# Patient Record
Sex: Female | Born: 1935 | Race: White | Hispanic: No | Smoking: Never smoker
Health system: Southern US, Community
[De-identification: ages and names within clinical notes are randomized; demographics above are authoritative.]

## PROBLEM LIST (undated history)

## (undated) DIAGNOSIS — M542 Cervicalgia: Secondary | ICD-10-CM

## (undated) DIAGNOSIS — E785 Hyperlipidemia, unspecified: Secondary | ICD-10-CM

## (undated) DIAGNOSIS — C801 Malignant (primary) neoplasm, unspecified: Secondary | ICD-10-CM

## (undated) DIAGNOSIS — I10 Essential (primary) hypertension: Secondary | ICD-10-CM

## (undated) DIAGNOSIS — R9430 Abnormal result of cardiovascular function study, unspecified: Secondary | ICD-10-CM

## (undated) DIAGNOSIS — G8929 Other chronic pain: Secondary | ICD-10-CM

## (undated) DIAGNOSIS — N950 Postmenopausal bleeding: Secondary | ICD-10-CM

## (undated) DIAGNOSIS — K219 Gastro-esophageal reflux disease without esophagitis: Secondary | ICD-10-CM

## (undated) DIAGNOSIS — I2510 Atherosclerotic heart disease of native coronary artery without angina pectoris: Secondary | ICD-10-CM

## (undated) DIAGNOSIS — K922 Gastrointestinal hemorrhage, unspecified: Secondary | ICD-10-CM

## (undated) HISTORY — DX: Postmenopausal bleeding: N95.0

## (undated) HISTORY — PX: DILATION AND CURETTAGE OF UTERUS: SHX78

## (undated) HISTORY — PX: CARDIAC CATHETERIZATION: SHX172

## (undated) HISTORY — DX: Other chronic pain: G89.29

## (undated) HISTORY — DX: Gastrointestinal hemorrhage, unspecified: K92.2

## (undated) HISTORY — DX: Essential (primary) hypertension: I10

## (undated) HISTORY — DX: Gastro-esophageal reflux disease without esophagitis: K21.9

## (undated) HISTORY — DX: Abnormal result of cardiovascular function study, unspecified: R94.30

## (undated) HISTORY — DX: Cervicalgia: M54.2

## (undated) HISTORY — DX: Atherosclerotic heart disease of native coronary artery without angina pectoris: I25.10

## (undated) HISTORY — DX: Hyperlipidemia, unspecified: E78.5

---

## 1968-04-12 HISTORY — PX: CHOLECYSTECTOMY: SHX55

## 2003-05-28 ENCOUNTER — Other Ambulatory Visit: Admission: RE | Admit: 2003-05-28 | Discharge: 2003-05-28 | Payer: Self-pay | Admitting: Obstetrics and Gynecology

## 2003-06-13 ENCOUNTER — Ambulatory Visit (HOSPITAL_COMMUNITY): Admission: RE | Admit: 2003-06-13 | Discharge: 2003-06-13 | Payer: Self-pay | Admitting: Obstetrics and Gynecology

## 2003-06-13 HISTORY — PX: CERVICAL POLYPECTOMY: SHX88

## 2005-01-20 ENCOUNTER — Inpatient Hospital Stay (HOSPITAL_COMMUNITY): Admission: EM | Admit: 2005-01-20 | Discharge: 2005-01-22 | Payer: Self-pay | Admitting: Emergency Medicine

## 2005-01-20 ENCOUNTER — Ambulatory Visit: Payer: Self-pay | Admitting: Cardiology

## 2005-01-22 ENCOUNTER — Ambulatory Visit: Payer: Self-pay | Admitting: Cardiology

## 2005-02-03 ENCOUNTER — Ambulatory Visit: Payer: Self-pay | Admitting: Cardiology

## 2005-03-03 ENCOUNTER — Ambulatory Visit: Payer: Self-pay | Admitting: Cardiology

## 2005-04-14 ENCOUNTER — Ambulatory Visit: Payer: Self-pay | Admitting: Cardiology

## 2005-06-10 ENCOUNTER — Inpatient Hospital Stay (HOSPITAL_COMMUNITY): Admission: AD | Admit: 2005-06-10 | Discharge: 2005-06-13 | Payer: Self-pay | Admitting: Cardiovascular Disease

## 2005-06-10 ENCOUNTER — Ambulatory Visit: Payer: Self-pay | Admitting: Cardiovascular Disease

## 2005-06-30 ENCOUNTER — Ambulatory Visit: Payer: Self-pay | Admitting: Cardiology

## 2005-10-07 ENCOUNTER — Ambulatory Visit: Payer: Self-pay | Admitting: Cardiology

## 2005-11-25 ENCOUNTER — Ambulatory Visit: Payer: Self-pay | Admitting: Cardiology

## 2005-12-10 ENCOUNTER — Ambulatory Visit: Payer: Self-pay | Admitting: Cardiology

## 2006-05-24 ENCOUNTER — Ambulatory Visit: Payer: Self-pay | Admitting: Cardiology

## 2006-08-24 ENCOUNTER — Ambulatory Visit: Payer: Self-pay | Admitting: Cardiology

## 2006-08-25 ENCOUNTER — Ambulatory Visit: Payer: Self-pay | Admitting: Cardiology

## 2006-09-09 ENCOUNTER — Encounter: Payer: Self-pay | Admitting: Cardiology

## 2006-12-27 ENCOUNTER — Ambulatory Visit: Payer: Self-pay | Admitting: Cardiology

## 2007-08-08 ENCOUNTER — Encounter: Payer: Self-pay | Admitting: Cardiology

## 2008-01-26 ENCOUNTER — Ambulatory Visit: Payer: Self-pay | Admitting: Cardiology

## 2008-01-26 ENCOUNTER — Encounter: Payer: Self-pay | Admitting: Physician Assistant

## 2008-02-01 ENCOUNTER — Ambulatory Visit: Payer: Self-pay | Admitting: Cardiology

## 2008-05-09 ENCOUNTER — Encounter: Payer: Self-pay | Admitting: Cardiology

## 2008-07-08 ENCOUNTER — Ambulatory Visit: Payer: Self-pay | Admitting: Cardiology

## 2008-08-16 ENCOUNTER — Encounter: Payer: Self-pay | Admitting: Cardiology

## 2008-08-30 ENCOUNTER — Encounter: Payer: Self-pay | Admitting: Cardiology

## 2008-10-21 ENCOUNTER — Encounter: Payer: Self-pay | Admitting: Cardiology

## 2008-12-11 ENCOUNTER — Encounter: Payer: Self-pay | Admitting: Cardiology

## 2009-02-04 ENCOUNTER — Ambulatory Visit: Payer: Self-pay | Admitting: Cardiology

## 2009-02-04 DIAGNOSIS — I259 Chronic ischemic heart disease, unspecified: Secondary | ICD-10-CM | POA: Insufficient documentation

## 2009-02-04 DIAGNOSIS — E669 Obesity, unspecified: Secondary | ICD-10-CM | POA: Insufficient documentation

## 2009-02-04 DIAGNOSIS — E785 Hyperlipidemia, unspecified: Secondary | ICD-10-CM | POA: Insufficient documentation

## 2009-02-04 DIAGNOSIS — M542 Cervicalgia: Secondary | ICD-10-CM | POA: Insufficient documentation

## 2009-02-04 DIAGNOSIS — I10 Essential (primary) hypertension: Secondary | ICD-10-CM | POA: Insufficient documentation

## 2009-03-14 ENCOUNTER — Ambulatory Visit: Payer: Self-pay | Admitting: Cardiology

## 2009-03-14 ENCOUNTER — Telehealth (INDEPENDENT_AMBULATORY_CARE_PROVIDER_SITE_OTHER): Payer: Self-pay | Admitting: *Deleted

## 2009-03-17 ENCOUNTER — Encounter: Payer: Self-pay | Admitting: Cardiology

## 2009-03-17 ENCOUNTER — Encounter (INDEPENDENT_AMBULATORY_CARE_PROVIDER_SITE_OTHER): Payer: Self-pay | Admitting: *Deleted

## 2009-03-17 ENCOUNTER — Ambulatory Visit: Payer: Self-pay | Admitting: Cardiology

## 2009-03-17 DIAGNOSIS — R079 Chest pain, unspecified: Secondary | ICD-10-CM | POA: Insufficient documentation

## 2009-03-17 DIAGNOSIS — R072 Precordial pain: Secondary | ICD-10-CM | POA: Insufficient documentation

## 2009-03-24 ENCOUNTER — Ambulatory Visit: Payer: Self-pay | Admitting: Cardiology

## 2009-03-24 ENCOUNTER — Encounter: Payer: Self-pay | Admitting: Cardiology

## 2009-03-26 ENCOUNTER — Encounter (INDEPENDENT_AMBULATORY_CARE_PROVIDER_SITE_OTHER): Payer: Self-pay | Admitting: *Deleted

## 2009-03-26 ENCOUNTER — Encounter: Payer: Self-pay | Admitting: Cardiology

## 2009-04-29 ENCOUNTER — Ambulatory Visit: Payer: Self-pay | Admitting: Cardiology

## 2009-12-23 ENCOUNTER — Encounter: Payer: Self-pay | Admitting: Cardiology

## 2010-02-02 ENCOUNTER — Telehealth (INDEPENDENT_AMBULATORY_CARE_PROVIDER_SITE_OTHER): Payer: Self-pay | Admitting: *Deleted

## 2010-02-16 ENCOUNTER — Telehealth (INDEPENDENT_AMBULATORY_CARE_PROVIDER_SITE_OTHER): Payer: Self-pay | Admitting: *Deleted

## 2010-02-17 ENCOUNTER — Encounter: Payer: Self-pay | Admitting: Cardiology

## 2010-05-12 NOTE — Progress Notes (Signed)
Summary: Pending Echo  ---- Converted from flag ---- ---- 02/02/2010 9:29 AM, Zachary George wrote: fell over the weekend and she has a thoracic vertebral fracture so we are cancelling the echo for now. ------------------------------

## 2010-05-12 NOTE — Letter (Signed)
Summary: Letter/ FAXED [facility]  Letter/ FAXED [facility]   Imported By: Dorise Hiss 02/24/2010 12:35:14  _____________________________________________________________________  External Attachment:    Type:   Image     Comment:   External Document

## 2010-05-12 NOTE — Miscellaneous (Signed)
  Clinical Lists Changes  Observations: Added new observation of NUCLEAR NOS: Comments :             1. This study was performed using the Standard Bruce exercise protocol. The patient exercised into stage 2 reaching 133 bpm, 89% MPHR. Maximum METs of 7 were achieved. The patient experienced no chest pain. No diagnostic ST changes. There was a hypertensive response to stress.  2. Probably normal LV perfusion. Limitations and artifact were due to breast tissue. There was a small, fixed defect in the mid anteroseptal segment(s). The degree of photon reduction was mild. There was a small, reversible defect in the mid anterolateral segment(s). The degree of photon reduction was mild. Suspect variable soft tissue attenuation, however cannot completely exclude a small degree of lateral ischemia. The patient's calculated post stress LVEF was 61%. TID Ratio 1.19.       (03/24/2009 15:19)      Nuclear Study  Procedure date:  03/24/2009  Findings:      Comments :             1. This study was performed using the Standard Bruce exercise protocol. The patient exercised into stage 2 reaching 133 bpm, 89% MPHR. Maximum METs of 7 were achieved. The patient experienced no chest pain. No diagnostic ST changes. There was a hypertensive response to stress.  2. Probably normal LV perfusion. Limitations and artifact were due to breast tissue. There was a small, fixed defect in the mid anteroseptal segment(s). The degree of photon reduction was mild. There was a small, reversible defect in the mid anterolateral segment(s). The degree of photon reduction was mild. Suspect variable soft tissue attenuation, however cannot completely exclude a small degree of lateral ischemia. The patient's calculated post stress LVEF was 61%. TID Ratio 1.19.        Appended Document:  Dr Degent's patient

## 2010-05-12 NOTE — Assessment & Plan Note (Signed)
Summary: ROV-PER KATZ ADD   Visit Type:  Follow-up Primary Provider:  Wyvonnia Lora  CC:  follow-up visit.  History of Present Illness: Pleasant female with past medical history of coronary artery disease status post PCI who returns following an evaluation for chest pain on December 3 by Dr. Myrtis Ser. The patient apparently had a stressful day on Thursday of last week. She was having intermittent chest pain. The pain was in the left chest area without radiation or associated symptoms. It is not pleuritic or positional nor is it related to food. It was nonexertional. It was unlike her previous pain prior to her intervention. It lasts several seconds and resolve spontaneously. Electrocardiogram on the third was normal. Dr. Myrtis Ser also drew blood work which showed normal enzymes times one. He asked that she return today. She had no further chest pain over the weekend. She states she had one second of chest pain earlier this morning. She does not have exertional chest pain, dyspnea on exertion, orthopnea, PND, pedal edema or syncope.  Preventive Screening-Counseling & Management  Alcohol-Tobacco     Smoking Status: never  Current Medications (verified): 1)  Plavix 75 Mg Tabs (Clopidogrel Bisulfate) .... Take 1 Tablet By Mouth Once A Day 2)  Pravachol 80 Mg Tabs (Pravastatin Sodium) .... Take 1 Tablet By Mouth Once A Day 3)  Aspirin 81 Mg Tbec (Aspirin) .... Take 1 Tablet By Mouth Once A Day 4)  Toprol Xl 50 Mg Xr24h-Tab (Metoprolol Succinate) .... Take 1 Tablet By Mouth Once A Day 5)  Lisinopril-Hydrochlorothiazide 20-25 Mg Tabs (Lisinopril-Hydrochlorothiazide) .... Take 1 Tablet By Mouth Once A Day 6)  Fish Oil 1000 Mg Caps (Omega-3 Fatty Acids) .... Take 2 Tablet By Mouth Two Times A Day 7)  Calcium 500 Mg Tabs (Calcium Carbonate) .... Take 1 Tablet By Mouth Two Times A Day 8)  Vitamin C 500 Mg Tabs (Ascorbic Acid) .... Take 1 Tablet By Mouth Once A Day 9)  Nitrostat 0.4 Mg Subl (Nitroglycerin) ....  Dissolve One Tablet Under Tongue For Severe Chest Pain As Needed Every 5 Minutes, Not To Exceed 3 in 15 Min Time Frame  Allergies (verified): 1)  Penicillin V Potassium (Penicillin V Potassium)  Past History:  Past Medical History: Reviewed history from 02/04/2009 and no changes required.  1. Severe single-vessel coronary artery disease.       a.     Non-ST elevation myocardial infarction/drug-eluting stenting        of circumflex, secondary to subtotal in-stent restenosis, March        2007.       b.     Non-ST elevation myocardial infarction/bare-metal stenting        of subtotal circumflex, October 2006.       c.     Ejection fraction 55%.       d.     Negative exercise Cardiolite; ejection fraction 65%, May        2008.   2. Hypertension.   3. Dyslipidemia.       a.     Zocor intolerant.   4. Obesity.   5. Chronic neck pain. Acute GI bleed  Social History: Reviewed history from 07/18/2008 and no changes required. Tobacco Use - No.  Alcohol Use - no  Review of Systems       no fevers or chills, productive cough, hemoptysis, dysphasia, odynophagia, melena, hematochezia, dysuria, hematuria, rash, seizure activity, orthopnea, PND, pedal edema, claudication. Remaining systems are negative.   Vital Signs:  Patient profile:   75 year old female Height:      62.5 inches Weight:      209 pounds Pulse rate:   67 / minute BP sitting:   137 / 82  (left arm) Cuff size:   large  Vitals Entered By: Carlye Grippe (March 17, 2009 9:42 AM) CC: follow-up visit   Physical Exam  General:  well-developed well-nourished in no acute distress. Skin is warm and dry. Head:  HEENT is normal. Neck:  supple with no bruits. No thyromegaly. Lungs:  clear to auscultation Heart:  regular rate and rhythm Abdomen:  soft and nontender. No masses palpated. Extremities:  no edema. Neurologic:  grossly intact.   Impression & Recommendations:  Problem # 1:  CHEST PAIN  (ICD-786.50) Patient's symptoms are atypical and lasts only seconds at a time. Electrocardiogram and enzymes were negative. I will arrange an outpatient Myoview for risk stratification. Her updated medication list for this problem includes:    Plavix 75 Mg Tabs (Clopidogrel bisulfate) .Marland Kitchen... Take 1 tablet by mouth once a day    Aspirin 81 Mg Tbec (Aspirin) .Marland Kitchen... Take 1 tablet by mouth once a day    Toprol Xl 50 Mg Xr24h-tab (Metoprolol succinate) .Marland Kitchen... Take 1 tablet by mouth once a day    Lisinopril-hydrochlorothiazide 20-25 Mg Tabs (Lisinopril-hydrochlorothiazide) .Marland Kitchen... Take 1 tablet by mouth once a day    Nitrostat 0.4 Mg Subl (Nitroglycerin) .Marland Kitchen... Dissolve one tablet under tongue for severe chest pain as needed every 5 minutes, not to exceed 3 in 15 min time frame  Problem # 2:  DYSLIPIDEMIA (ICD-272.4) Continue statin. Her updated medication list for this problem includes:    Pravachol 80 Mg Tabs (Pravastatin sodium) .Marland Kitchen... Take 1 tablet by mouth once a day  Problem # 3:  ESSENTIAL HYPERTENSION, BENIGN (ICD-401.1) Blood pressure controlled on present medications. Will continue. Her updated medication list for this problem includes:    Aspirin 81 Mg Tbec (Aspirin) .Marland Kitchen... Take 1 tablet by mouth once a day    Toprol Xl 50 Mg Xr24h-tab (Metoprolol succinate) .Marland Kitchen... Take 1 tablet by mouth once a day    Lisinopril-hydrochlorothiazide 20-25 Mg Tabs (Lisinopril-hydrochlorothiazide) .Marland Kitchen... Take 1 tablet by mouth once a day  Problem # 4:  CORONARY ARTERY DISEASE, S/P PTCA (ICD-414.9) Continue aspirin, Plavix, beta blocker, ACE inhibitor and statin. Her updated medication list for this problem includes:    Plavix 75 Mg Tabs (Clopidogrel bisulfate) .Marland Kitchen... Take 1 tablet by mouth once a day    Aspirin 81 Mg Tbec (Aspirin) .Marland Kitchen... Take 1 tablet by mouth once a day    Toprol Xl 50 Mg Xr24h-tab (Metoprolol succinate) .Marland Kitchen... Take 1 tablet by mouth once a day    Lisinopril-hydrochlorothiazide 20-25 Mg Tabs  (Lisinopril-hydrochlorothiazide) .Marland Kitchen... Take 1 tablet by mouth once a day    Nitrostat 0.4 Mg Subl (Nitroglycerin) .Marland Kitchen... Dissolve one tablet under tongue for severe chest pain as needed every 5 minutes, not to exceed 3 in 15 min time frame  Orders: Nuclear Med (Nuc Med)  Patient Instructions: 1)  Stress Myoview 2)  Follow up in 4-6 weeks.

## 2010-05-12 NOTE — Assessment & Plan Note (Signed)
Summary: ADD ON PER DR. TAPPER/Ellyssa Zagal, CHEST PAIN-JM   Visit Type:  Follow-up Primary Provider:  Wyvonnia Lora  CC:  CAD.  History of Present Illness: The patient is seen today for evaluation of chest pain.  She followed carefully by Dr.De Natasha Bence.  She has known disease and had an intervention in 2006.  She then had in-stent renarrowing and had another intervention in 2007.  She's done well since then.  Historically when she had her significant lesions, she had a full tight sensation in her chest and it feeling needing to belch.  Yesterday the patient was under severe stress.  She then began to feel some discomfort localized to her left upper chest.  It does not sound to me like her classic angina.  She did not have any belching.  This discomfort waxed and waned during the day.  She then took 3 full dose aspirins and improved.  She did not try a nitroglycerin.  This morning she called Dr.Tapper's office and communication came to [place] and we arranged to see her as an add-on.  She is stable now.  She does not have any chest pain.  She became teary-eyed when beginning to tell me about the amount of stress she had yesterday.  We also discussed the fact he has never tried nitroglycerin.  She was told by a friend that if she did try it she might have a severe headache.  We discussed this fully.  Preventive Screening-Counseling & Management  Alcohol-Tobacco     Smoking Status: never  Current Medications (verified): 1)  Plavix 75 Mg Tabs (Clopidogrel Bisulfate) .... Take 1 Tablet By Mouth Once A Day 2)  Pravachol 80 Mg Tabs (Pravastatin Sodium) .... Take 1 Tablet By Mouth Once A Day 3)  Aspirin 81 Mg Tbec (Aspirin) .... Take 1 Tablet By Mouth Once A Day 4)  Toprol Xl 50 Mg Xr24h-Tab (Metoprolol Succinate) .... Take 1 Tablet By Mouth Once A Day 5)  Lisinopril-Hydrochlorothiazide 20-25 Mg Tabs (Lisinopril-Hydrochlorothiazide) .... Take 1 Tablet By Mouth Once A Day 6)  Fish Oil 1000 Mg Caps (Omega-3 Fatty  Acids) .... Take 2 Tablet By Mouth Two Times A Day 7)  Calcium 500 Mg Tabs (Calcium Carbonate) .... Take 1 Tablet By Mouth Two Times A Day 8)  Vitamin C 500 Mg Tabs (Ascorbic Acid) .... Take 1 Tablet By Mouth Once A Day 9)  Nitrostat 0.4 Mg Subl (Nitroglycerin) .... Dissolve One Tablet Under Tongue For Severe Chest Pain As Needed Every 5 Minutes, Not To Exceed 3 in 15 Min Time Frame  Allergies: 1)  Penicillin V Potassium (Penicillin V Potassium)  Past History:  Past Medical History: Last updated: 02/04/2009  1. Severe single-vessel coronary artery disease.       a.     Non-ST elevation myocardial infarction/drug-eluting stenting        of circumflex, secondary to subtotal in-stent restenosis, March        2007.       b.     Non-ST elevation myocardial infarction/bare-metal stenting        of subtotal circumflex, October 2006.       c.     Ejection fraction 55%.       d.     Negative exercise Cardiolite; ejection fraction 65%, May        2008.   2. Hypertension.   3. Dyslipidemia.       a.     Zocor intolerant.   4. Obesity.  5. Chronic neck pain. Acute GI bleed  Review of Systems       Patient denies fever, chills, headache, sweats, rash, change in vision, change in hearing, shortness of breath, cough, nausea vomiting, urinary symptoms, musculoskeletal problems.  All other systems are reviewed and are negative.  Vital Signs:  Patient profile:   75 year old female Height:      62.5 inches Weight:      207.50 pounds BMI:     37.48 BP sitting:   140 / 85  (left arm) Cuff size:   large  Vitals Entered By: Cyril Loosen, RN, BSN (March 14, 2009 11:10 AM)  Nutrition Counseling: Patient's BMI is greater than 25 and therefore counseled on weight management options. CC: CAD Comments Pt states chest tightness yesterday. She states she did not think to take NTG but did take ASA 325mg  x3 last evening with relief. She thought this was the dosage EMS usually gives. Notified pt  they usually give 81mg  x 3 or 4 not 325mg .    Physical Exam  General:  patient is overweight but stable in general. Head:  head is normocephalic. Eyes:  no xanthelasma. Neck:  no jugular venous tension. Chest Wall:  there is no chest wall tenderness today. Lungs:  lungs are clear.  Respiratory effort is nonlabored. Heart:  cardiac exam revealed S1-S2.  There no clicks or significant murmurs. Abdomen:  abdomen is soft. Msk:  no musculoskeletal deformities. Extremities:  no peripheral edema. Skin:  no skin rashes. Psych:  patient is oriented to person time and place.  Affect is normal.   Impression & Recommendations:  Problem # 1:  DYSLIPIDEMIA (ICD-272.4)  Her updated medication list for this problem includes:    Pravachol 80 Mg Tabs (Pravastatin sodium) .Marland Kitchen... Take 1 tablet by mouth once a day Patient is on appropriate medications for lipids.  Problem # 2:  ESSENTIAL HYPERTENSION, BENIGN (ICD-401.1)  Her updated medication list for this problem includes:    Aspirin 81 Mg Tbec (Aspirin) .Marland Kitchen... Take 1 tablet by mouth once a day    Toprol Xl 50 Mg Xr24h-tab (Metoprolol succinate) .Marland Kitchen... Take 1 tablet by mouth once a day    Lisinopril-hydrochlorothiazide 20-25 Mg Tabs (Lisinopril-hydrochlorothiazide) .Marland Kitchen... Take 1 tablet by mouth once a day Blood pressure is reasonably controlled today.  No change in her medicines.  Problem # 3:  CORONARY ARTERY DISEASE, S/P PTCA (ICD-414.9)  Her updated medication list for this problem includes:    Plavix 75 Mg Tabs (Clopidogrel bisulfate) .Marland Kitchen... Take 1 tablet by mouth once a day    Aspirin 81 Mg Tbec (Aspirin) .Marland Kitchen... Take 1 tablet by mouth once a day    Toprol Xl 50 Mg Xr24h-tab (Metoprolol succinate) .Marland Kitchen... Take 1 tablet by mouth once a day    Lisinopril-hydrochlorothiazide 20-25 Mg Tabs (Lisinopril-hydrochlorothiazide) .Marland Kitchen... Take 1 tablet by mouth once a day    Nitrostat 0.4 Mg Subl (Nitroglycerin) .Marland Kitchen... Dissolve one tablet under tongue for severe  chest pain as needed every 5 minutes, not to exceed 3 in 15 min time frame The patient has known coronary artery disease.  As outlined in the history of present illness she had symptoms yesterday.  I am not convinced that this is her true angina..  She is under a great deal of stress.  EKG was done and reviewed by me.  There is no significant abnormality.  We will check CPK and troponin today to be sure that there was no significant change.  However  I'm not convinced that this was her angina and she will be allowed to go home.  We will give her a new prescription of nitroglycerin and have her try some if she has recurrent symptoms.  I will arrange for early followup with Dr.De Natasha Bence to be sure that she remains stable.  If there are significant recurring symptoms repeat catheterization will be needed.  Problem # 4:  * STRESS I do not have the specifics of why the patient was stressed yesterday.  I believe this played a role in her feeling poorly.  Hopefully she will be able to resolve these issues.  Other Orders: EKG w/ Interpretation (93000) T-Troponin I ([id number]) T-CK MB Fract ([id number]) T-CK Total ([id number])  Patient Instructions: 1)  Labs:  CPK, Troponin level - results by end of day 2)  Script for as needed nitroglycerin. 3)  Follow up on Monday, March 17, 2009 at 9:45 Prescriptions: NITROSTAT 0.4 MG SUBL (NITROGLYCERIN) dissolve one tablet under tongue for severe chest pain as needed every 5 minutes, not to exceed 3 in 15 min time frame  #25 x 3   Entered by:   Hoover Brunette, LPN   Authorized by:   Talitha Givens, MD, [facility]   Signed by:   Hoover Brunette, LPN on 96/29/5284   Method used:   Electronically to        [facility]* (retail)       [address]       [place], [place]  [address]       Ph: [phone]       Fax: [phone]   RxID:   [id number]

## 2010-05-12 NOTE — Assessment & Plan Note (Signed)
Summary: Nuclear    Nuclear ETT  Procedure date:  03/24/2009  Findings:      standard Bruce exercise protocol.  Patient exercised into stage II reaching hundred 33 beats/min 89% of maximum predicted heart rate.  Maximum 7 METs were achieved.  No chest pain reported no ST segment changes hypertensive blood pressure response.  Normal LV perfusion with a small fixed defect in the mid anteroseptal segments.  Suspects variable soft tissue attenuation however cannot completely exclude a small degree of lateral ischemia.  Ejection fraction 61%.  Of

## 2010-05-12 NOTE — Letter (Signed)
Summary: Pharmacist, community at [facility]. [address]   [place], [place] [address]   Phone: [phone]  Fax: [phone]      [facility] Cardi[facility]  Cardiolite Stress Test     [facility]  Your doctor has ordered a Cardiolite Stress Test to help determine the condition of your heart during stress. If you take blood pressure medicine, ask your doctor if you should take it the day of your test. You should not have anything to eat or drink at least 4 hours before your test is scheduled, and no caffeine (coffee, tea, decaf. or chocolate) for 24 hours before your test.   You will need to register at the Outpatient/Main Entrance at the hospital 30 minutes before your appointment time. It is a good idea to bring a copy of your order with you. They will direct you to the Diagnostic Imaging (Radiology) Department.  You will be asked to undress from the waist up and be given a hospital gown to wear, so dress comfortably from the waist down, for example:    Sweat pants, shorts or skirt   Rubber-soled lace up shoes (i.e. tennis shoes)  Plan on about three hours from registration to release from the hospital.    Date of Test:              Time of Test

## 2010-05-12 NOTE — Letter (Signed)
Summary: Engineer, materials at [facility]  [address]   [place], [place] [address]   Phone: [phone]  Fax: [phone]        March 26, 2009 MRN: [id number]     [facility] [address] [place], [place]  [address]    Dear Ms. Botero,  Your test ordered by Selena Batten has been reviewed by your physician (or physician assistant) and was found to be normal or stable. Your physician (or physician assistant) felt no changes were needed at this time.  ____ Echocardiogram  __X__ Cardiac Stress Test  ____ Lab Work  ____ Peripheral vascular study of arms, legs or neck  ____ CT scan or X-ray  ____ Lung or Breathing test  ____ Other:   Thank you.   Cyril Loosen, RN, BSN    Duane Boston, M.D., F.A.C.C. Thressa Sheller, M.D., F.A.C.C. Oneal Grout, M.D., F.A.C.C. Cheree Ditto, M.D., F.A.C.C. Daiva Nakayama, M.D., F.A.C.C. Kenney Houseman, M.D., F.A.C.C. Jeanne Ivan, PA-C

## 2010-05-12 NOTE — Assessment & Plan Note (Signed)
Summary: 6 wk fu -srs   Visit Type:  Follow-up Primary Provider:  Wyvonnia Lora  CC:  follow-up visit.  History of Present Illness: the patient is a 75 year old female with history of coronary artery disease status post prior stent placement. She was evaluated a few months ago with atypical chest pain. Cardiolite study was low risk, albeit with a questionable small lateral defect. The patient however reports no recurrent symptoms. She reports no chest pain shortness of breath orthopnea or PND she has no palpitations or syncope. She remains physically active. Her only complaint of symptoms related to benign positional vertigo with dizziness  Clinical Review Panels:  Nuclear Stress Testing Nuclear Stress Test Findings standard Bruce exercise protocol.  Patient exercised into stage II reaching hundred 33 beats/min 89% of maximum predicted heart rate.  Maximum 7 METs were achieved.  No chest pain reported no ST segment changes hypertensive blood pressure response.  Normal LV perfusion with a small fixed defect in the mid anteroseptal segments.  Suspects variable soft tissue attenuation however cannot completely exclude a small degree of lateral ischemia.  Ejection fraction 61%.  Of (03/24/2009)    Preventive Screening-Counseling & Management  Alcohol-Tobacco     Smoking Status: never  Current Medications (verified): 1)  Plavix 75 Mg Tabs (Clopidogrel Bisulfate) .... Take 1 Tablet By Mouth Once A Day 2)  Pravachol 80 Mg Tabs (Pravastatin Sodium) .... Take 1 Tablet By Mouth Once A Day 3)  Aspirin 81 Mg Tbec (Aspirin) .... Take 1 Tablet By Mouth Once A Day 4)  Toprol Xl 50 Mg Xr24h-Tab (Metoprolol Succinate) .... Take 1 Tablet By Mouth Once A Day 5)  Lisinopril-Hydrochlorothiazide 20-25 Mg Tabs (Lisinopril-Hydrochlorothiazide) .... Take 1 Tablet By Mouth Once A Day 6)  Fish Oil 1000 Mg Caps (Omega-3 Fatty Acids) .... Take 2 Tablet By Mouth Two Times A Day 7)  Calcium 500 Mg Tabs (Calcium  Carbonate) .... Take 1 Tablet By Mouth Two Times A Day 8)  Vitamin C 500 Mg Tabs (Ascorbic Acid) .... Take 1 Tablet By Mouth Once A Day 9)  Nitrostat 0.4 Mg Subl (Nitroglycerin) .... Dissolve One Tablet Under Tongue For Severe Chest Pain As Needed Every 5 Minutes, Not To Exceed 3 in 15 Min Time Frame  Allergies (verified): 1)  Penicillin V Potassium (Penicillin V Potassium)  Comments:  Nurse/Medical Assistant: The patient is currently on medications but does not know the name or dosage at this time. Instructed to contact our office with details. Will update medication list at that time.  Past History:  Past Medical History: Last updated: 02/04/2009  1. Severe single-vessel coronary artery disease.       a.     Non-ST elevation myocardial infarction/drug-eluting stenting        of circumflex, secondary to subtotal in-stent restenosis, March        2007.       b.     Non-ST elevation myocardial infarction/bare-metal stenting        of subtotal circumflex, October 2006.       c.     Ejection fraction 55%.       d.     Negative exercise Cardiolite; ejection fraction 65%, May        2008.   2. Hypertension.   3. Dyslipidemia.       a.     Zocor intolerant.   4. Obesity.   5. Chronic neck pain. Acute GI bleed  Past Surgical History: Last updated: 07/18/2008 Unremarkable  Family History: Last updated: 07/18/2008 Family History of Coronary Artery Disease:   Social History: Last updated: 07/18/2008 Tobacco Use - No.  Alcohol Use - no  Risk Factors: Smoking Status: never (04/29/2009)  Review of Systems       The patient complains of dizziness.  The patient denies fatigue, malaise, fever, weight gain/loss, vision loss, decreased hearing, hoarseness, chest pain, palpitations, shortness of breath, prolonged cough, wheezing, sleep apnea, coughing up blood, abdominal pain, blood in stool, nausea, vomiting, diarrhea, heartburn, incontinence, blood in urine, muscle weakness, joint  pain, leg swelling, rash, skin lesions, headache, fainting, depression, anxiety, enlarged lymph nodes, easy bruising or bleeding, and environmental allergies.    Vital Signs:  Patient profile:   75 year old female Height:      62.5 inches Weight:      204 pounds Pulse rate:   74 / minute BP sitting:   120 / 78  (left arm) Cuff size:   large  Vitals Entered By: Carlye Grippe (April 29, 2009 12:52 PM) CC: follow-up visit   Physical Exam  Additional Exam:  General: Well-developed, well-nourished in no distress head: Normocephalic and atraumatic eyes PERRLA/EOMI intact, conjunctiva and lids normal nose: No deformity or lesions mouth normal dentition, normal posterior pharynx neck: Supple, no JVD.  No masses, thyromegaly or abnormal cervical nodes lungs: Normal breath sounds bilaterally without wheezing.  Normal percussion heart: regular rate and rhythm with normal S1 and S2, no S3 or S4.  PMI is normal.  No pathological murmurs abdomen: Normal bowel sounds, abdomen is soft and nontender without masses, organomegaly or hernias noted.  No hepatosplenomegaly musculoskeletal: Back normal, normal gait muscle strength and tone normal pulsus: Pulse is normal in all 4 extremities Extremities: No peripheral pitting edema neurologic: Alert and oriented x 3 skin: Intact without lesions or rashes cervical nodes: No significant adenopathy psychologic: Normal affect    Impression & Recommendations:  Problem # 1:  CORONARY ARTERY DISEASE, S/P PTCA (ICD-414.9) the patient has a history of coronary artery disease. She status post prior stent placement and she continues to be on Plavix. She has no recurrent chest pain several months ago showed a low risk Cardiolite study. Her updated medication list for this problem includes:    Plavix 75 Mg Tabs (Clopidogrel bisulfate) .Marland Kitchen... Take 1 tablet by mouth once a day    Aspirin 81 Mg Tbec (Aspirin) .Marland Kitchen... Take 1 tablet by mouth once a day    Toprol Xl  50 Mg Xr24h-tab (Metoprolol succinate) .Marland Kitchen... Take 1 tablet by mouth once a day    Lisinopril-hydrochlorothiazide 20-25 Mg Tabs (Lisinopril-hydrochlorothiazide) .Marland Kitchen... Take 1 tablet by mouth once a day    Nitrostat 0.4 Mg Subl (Nitroglycerin) .Marland Kitchen... Dissolve one tablet under tongue for severe chest pain as needed every 5 minutes, not to exceed 3 in 15 min time frame  Problem # 2:  DYSLIPIDEMIA (ICD-272.4)  Her updated medication list for this problem includes:    Pravachol 80 Mg Tabs (Pravastatin sodium) .Marland Kitchen... Take 1 tablet by mouth once a day  Problem # 3:  ESSENTIAL HYPERTENSION, BENIGN (ICD-401.1) blood pressure is well controlled. Continue current medical regimen. Her updated medication list for this problem includes:    Aspirin 81 Mg Tbec (Aspirin) .Marland Kitchen... Take 1 tablet by mouth once a day    Toprol Xl 50 Mg Xr24h-tab (Metoprolol succinate) .Marland Kitchen... Take 1 tablet by mouth once a day    Lisinopril-hydrochlorothiazide 20-25 Mg Tabs (Lisinopril-hydrochlorothiazide) .Marland Kitchen... Take 1 tablet by mouth once a day  Patient Instructions: 1)  Your physician recommends that you continue on your current medications as directed. Please refer to the Current Medication list given to you today. 2)  Follow up in  1 year.   Prescriptions: PLAVIX 75 MG TABS (CLOPIDOGREL BISULFATE) Take 1 tablet by mouth once a day  #30 x 11   Entered by:   Hoover Brunette, LPN   Authorized by:   Lewayne Bunting, MD, [facility]   Signed by:   Hoover Brunette, LPN on 04/54/0981   Method used:   Electronically to        [facility]* (retail)       [address]       [place], [place]  [address]       Ph: [phone]       Fax: [phone]   RxID:   [id number]

## 2010-05-12 NOTE — Progress Notes (Signed)
Summary: steroid injection  Phone Note Other Incoming   Caller: FAX FOR MEDICAL CLEARANCE  Summary of Call: Requesting medical clearance for thoracic epidural steroid injection.  ?? Holding Plavix/ASA.  Hoover Brunette, LPN  February 16, 2010 2:43 PM   Follow-up for Phone Call        Plavix can be held one day before. Ideally patient should continue low-dose aspirin 81 mg a day if okay with anesthesiologist prior history of in-stent thrombosis Follow-up by: Lewayne Bunting, MD, [facility],  February 16, 2010 3:23 PM  Additional Follow-up for Phone Call Additional follow up Details #1::        Will fax note above.  Additional Follow-up by: Hoover Brunette, LPN,  February 17, 2010 11:30 AM

## 2010-05-12 NOTE — Miscellaneous (Signed)
Summary: PMH   Past Medical History:     1. Severe single-vessel coronary artery disease.          a.     Non-ST elevation myocardial infarction/drug-eluting stenting           of circumflex, secondary to subtotal in-stent restenosis, March           2007.          b.     Non-ST elevation myocardial infarction/bare-metal stenting           of subtotal circumflex, October 2006.          c.     Ejection fraction 55%.          d.     Negative exercise Cardiolite; ejection fraction 65%, May           2008.      2. Hypertension.      3. Dyslipidemia.          a.     Zocor intolerant.      4. Obesity.      5. Chronic neck pain.    Acute GI bleed Clinical Lists Changes  Problems: Added new problem of CORONARY ARTERY DISEASE, S/P PTCA (ICD-414.9) Added new problem of ESSENTIAL HYPERTENSION, BENIGN (ICD-401.1) Added new problem of DYSLIPIDEMIA (ICD-272.4) Added new problem of OBESITY (ICD-278.00) Added new problem of NECK PAIN, CHRONIC (ICD-723.1) Observations: Added new observation of PAST MED HX:  1. Severe single-vessel coronary artery disease.       a.     Non-ST elevation myocardial infarction/drug-eluting stenting        of circumflex, secondary to subtotal in-stent restenosis, March        2007.       b.     Non-ST elevation myocardial infarction/bare-metal stenting        of subtotal circumflex, October 2006.       c.     Ejection fraction 55%.       d.     Negative exercise Cardiolite; ejection fraction 65%, May        2008.   2. Hypertension.   3. Dyslipidemia.       a.     Zocor intolerant.   4. Obesity.   5. Chronic neck pain. Acute GI bleed  (02/04/2009 7:37)

## 2010-05-12 NOTE — Progress Notes (Signed)
Summary: Chest pain  Phone Note From Other Clinic Call back at [facility] Phone [phone] Call back at [phone]   Caller: Arline Asp with Dr. Jackolyn Confer office Call For: Nurse Summary of Call: Arline Asp called regarding this mutual patient. She states pt called their office with chest pain yesterday. She took ASA x3. She does not have pain today but Dr. Margo Common would like her evaluated d/t h/o CAD.   Spoke with Dr. Myrtis Ser. he will see pt this morning at 11am.   Cinidy with Dr. Jackolyn Confer office notified. She will notify pt. Initial call taken by: Cyril Loosen, RN, BSN,  March 14, 2009 9:51 AM

## 2010-05-12 NOTE — Miscellaneous (Signed)
Summary: Orders Update  Clinical Lists Changes  Orders: Added new Referral order of 2-D Echocardiogram (2D Echo) - Signed 

## 2010-05-12 NOTE — Assessment & Plan Note (Signed)
Summary: 6 MO FU SEPT REMINDER-SRS   Visit Type:  Follow-up Primary Provider:  Wyvonnia Lora   History of Present Illness: the patient is a 75 year old female with history of NSTEMI myocardial infarctionand stent placement. The patient is a prior history of instent restenosis. She has remained on Plavix. She had some GI bleeding when using a combination of high-dose aspirin and Plavix. Check colonoscopy in the meanwhile shows no signs of bleeding. Her hemoglobin has been stable on combination of low-dose aspirin and Plavix. She denies any chest pain shortness of breath orthopnea PND. Patient is overweight and would like to lose a little weight. She does remain active however. She denies any palpitations or syncope. She is intolerant to Zocor but is able to tolerate Pravachol and her is followed by primary care physician. Otherwise has no cardiovascular complaints.  Clinical Review Panels:  Nuclear Stress Testing Nuclear Stress Test Findings ejection fraction 65%; normal perfusion images. (08/25/2006)  Cardiac Imaging Cardiac Cath Findings Successful Taxus drug-eluting stenting of 99% left circumflex artery in-stent restenosis. Residual nonobstructive disease. Ejection fraction 55%; mild inferior hypokinesis. (06/11/2005)    Preventive Screening-Counseling & Management  Alcohol-Tobacco     Smoking Status: never  Current Medications (verified): 1)  Plavix 75 Mg Tabs (Clopidogrel Bisulfate) .... Take 1 Tablet By Mouth Once A Day 2)  Pravachol 80 Mg Tabs (Pravastatin Sodium) .... Take 1 Tablet By Mouth Once A Day 3)  Aspirin 81 Mg Tbec (Aspirin) .... Take 1 Tablet By Mouth Once A Day 4)  Toprol Xl 50 Mg Xr24h-Tab (Metoprolol Succinate) .... Take 1 Tablet By Mouth Once A Day 5)  Lisinopril-Hydrochlorothiazide 20-25 Mg Tabs (Lisinopril-Hydrochlorothiazide) .... Take 1 Tablet By Mouth Once A Day 6)  Fish Oil 1000 Mg Caps (Omega-3 Fatty Acids) .... Take 2 Tablet By Mouth Two Times A Day 7)   Calcium 500 Mg Tabs (Calcium Carbonate) .... Take 1 Tablet By Mouth Two Times A Day  Allergies: 1)  Penicillin V Potassium (Penicillin V Potassium)  Past History:  Past Medical History: Last updated: 02/04/2009  1. Severe single-vessel coronary artery disease.       a.     Non-ST elevation myocardial infarction/drug-eluting stenting        of circumflex, secondary to subtotal in-stent restenosis, March        2007.       b.     Non-ST elevation myocardial infarction/bare-metal stenting        of subtotal circumflex, October 2006.       c.     Ejection fraction 55%.       d.     Negative exercise Cardiolite; ejection fraction 65%, May        2008.   2. Hypertension.   3. Dyslipidemia.       a.     Zocor intolerant.   4. Obesity.   5. Chronic neck pain. Acute GI bleed  Past Surgical History: Last updated: 07/18/2008 Unremarkable  Family History: Last updated: 07/18/2008 Family History of Coronary Artery Disease:   Social History: Last updated: 07/18/2008 Tobacco Use - No.  Alcohol Use - no  Risk Factors: Smoking Status: never (02/04/2009)  Family History: Reviewed history from 07/18/2008 and no changes required. Family History of Coronary Artery Disease:   Social History: Reviewed history from 07/18/2008 and no changes required. Tobacco Use - No.  Alcohol Use - no  Review of Systems       The patient complains of weight gain.  The patient  denies anorexia, fever, weight loss, vision loss, decreased hearing, hoarseness, chest pain, syncope, dyspnea on exertion, peripheral edema, prolonged cough, headaches, hemoptysis, abdominal pain, melena, hematochezia, severe indigestion/heartburn, hematuria, incontinence, genital sores, muscle weakness, suspicious skin lesions, transient blindness, difficulty walking, depression, unusual weight change, abnormal bleeding, enlarged lymph nodes, and angioedema.    Vital Signs:  Patient profile:   75 year old female Height:       62 inches Weight:      217 pounds BMI:     39.83 Pulse rate:   80 / minute BP sitting:   135 / 81  (left arm) Cuff size:   large  Vitals Entered By: Cyril Loosen, RN, BSN (February 04, 2009 10:22 AM)  Nutrition Counseling: Patient's BMI is greater than 25 and therefore counseled on weight management options. Comments 6 month follow up. No cardiac complaints.   Physical Exam  General:  Well developed, well nourished, in no acute distress. Head:  normocephalic and atraumatic Eyes:  PERRLA/EOM intact; conjunctiva and lids normal. Ears:  TM's intact and clear with normal canals and hearing Nose:  no deformity, discharge, inflammation, or lesions Mouth:  Teeth, gums and palate normal. Oral mucosa normal. Neck:  Neck supple, no JVD. No masses, thyromegaly or abnormal cervical nodes. Lungs:  Clear bilaterally to auscultation and percussion. Heart:  Non-displaced PMI, chest non-tender; regular rate and rhythm, S1, S2 without murmurs, rubs or gallops. Carotid upstroke normal, no bruit. Normal abdominal aortic size, no bruits. Femorals normal pulses, no bruits. Pedals normal pulses. No edema, no varicosities. Abdomen:  Bowel sounds positive; abdomen soft and non-tender without masses, organomegaly, or hernias noted. No hepatosplenomegaly. Msk:  Back normal, normal gait. Muscle strength and tone normal. Pulses:  pulses normal in all 4 extremities Extremities:  No clubbing or cyanosis. Neurologic:  Alert and oriented x 3. Skin:  Intact without lesions or rashes. Cervical Nodes:  no significant adenopathy Psych:  Normal affect.   EKG  Procedure date:  02/04/2009  Findings:      normal sinus rhythm low-voltage QRS no acute ischemic changes. Heart rate 72 beats per minute  Impression & Recommendations:  Problem # 1:  CORONARY ARTERY DISEASE, S/P PTCA (ICD-414.9) we recommend for the patient to stay on Plavix in conjunction with low-dose aspirin. No current evidence of bleeding. Given  her prior history of in-stent restenosis indefinite use of Plavix is advisable Her updated medication list for this problem includes:    Plavix 75 Mg Tabs (Clopidogrel bisulfate) .Marland Kitchen... Take 1 tablet by mouth once a day    Aspirin 81 Mg Tbec (Aspirin) .Marland Kitchen... Take 1 tablet by mouth once a day    Toprol Xl 50 Mg Xr24h-tab (Metoprolol succinate) .Marland Kitchen... Take 1 tablet by mouth once a day    Lisinopril-hydrochlorothiazide 20-25 Mg Tabs (Lisinopril-hydrochlorothiazide) .Marland Kitchen... Take 1 tablet by mouth once a day  Orders: EKG w/ Interpretation (93000)  Problem # 2:  ESSENTIAL HYPERTENSION, BENIGN (ICD-401.1) blood pressure is stable. I made no change in the patient's medical regimen. Her updated medication list for this problem includes:    Aspirin 81 Mg Tbec (Aspirin) .Marland Kitchen... Take 1 tablet by mouth once a day    Toprol Xl 50 Mg Xr24h-tab (Metoprolol succinate) .Marland Kitchen... Take 1 tablet by mouth once a day    Lisinopril-hydrochlorothiazide 20-25 Mg Tabs (Lisinopril-hydrochlorothiazide) .Marland Kitchen... Take 1 tablet by mouth once a day  Problem # 3:  OBESITY (ICD-278.00) encouraged patient to continue with exercise program.  Patient Instructions: 1)  patient in followup  in one year. 2)  Stress test in one year is recommended. 3)  Followup blood work with your primary care physician, including lipid/cholesterol panel

## 2010-06-09 ENCOUNTER — Encounter: Payer: Self-pay | Admitting: Cardiology

## 2010-06-09 ENCOUNTER — Ambulatory Visit (INDEPENDENT_AMBULATORY_CARE_PROVIDER_SITE_OTHER): Payer: MEDICARE | Admitting: Cardiology

## 2010-06-09 DIAGNOSIS — K922 Gastrointestinal hemorrhage, unspecified: Secondary | ICD-10-CM | POA: Insufficient documentation

## 2010-06-09 DIAGNOSIS — I2510 Atherosclerotic heart disease of native coronary artery without angina pectoris: Secondary | ICD-10-CM

## 2010-06-18 NOTE — Assessment & Plan Note (Signed)
Summary: 1 yr fu  vs   Visit Type:  Follow-up Primary Provider:  Wyvonnia Lora   History of Present Illness: The patient is a 75 year old female with a history of coronary artery disease, status post prior stent placement.  A stress test was done in 2010.  This was a Cardiolite study.  This was low risk albeit with a question of small lateral defect.  During her last office visit however the patient was asymptomatic.  She remains physically very active. The patient had a thoracic vertebral fracture.  Echocardiogram was canceled.  The patient apparently scheduled for a thoracic epidural steroid injection.  The patient has been aware to stop her Plavix 7 days before epidural injection.  At this point as is safe to do as the patient had her stent placed in 2007.  She already had several injections and she has done well with this.  She also needs bladder surgery and again will need to come off Plavix.  I told her that she can come off 7 days prior.  Aspirin should be continued at all possible if not this can also be discontinued.  Preventive Screening-Counseling & Management  Alcohol-Tobacco     Smoking Status: never  Current Medications (verified): 1)  Plavix 75 Mg Tabs (Clopidogrel Bisulfate) .... Take 1 Tablet By Mouth Once A Day 2)  Pravachol 80 Mg Tabs (Pravastatin Sodium) .... Take 1 Tablet By Mouth Once A Day 3)  Aspirin 81 Mg Tbec (Aspirin) .... Take 1 Tablet By Mouth Once A Day 4)  Toprol Xl 50 Mg Xr24h-Tab (Metoprolol Succinate) .... Take 1 Tablet By Mouth Once A Day 5)  Lisinopril-Hydrochlorothiazide 20-25 Mg Tabs (Lisinopril-Hydrochlorothiazide) .... Take 1 Tablet By Mouth Once A Day 6)  Fish Oil 1000 Mg Caps (Omega-3 Fatty Acids) .... Take 2 Tablet By Mouth Two Times A Day 7)  Calcium 500 Mg Tabs (Calcium Carbonate) .... Take 1 Tablet By Mouth Two Times A Day 8)  Vitamin C 500 Mg Tabs (Ascorbic Acid) .... Take 1 Tablet By Mouth Once A Day 9)  Nitrostat 0.4 Mg Subl (Nitroglycerin)  .... Dissolve One Tablet Under Tongue For Severe Chest Pain As Needed Every 5 Minutes, Not To Exceed 3 in 15 Min Time Frame 10)  Zantac 75 75 Mg Tabs (Ranitidine Hcl) .... Take 1 Tablet By Mouth Once A Day  Allergies (verified): 1)  Penicillin V Potassium (Penicillin V Potassium)  Comments:  Nurse/Medical Assistant: The patient's medications and allergies were verbally reviewed with the patient and were updated in the Medication and Allergy Lists.  Past History:  Past Medical History: Last updated: 02/04/2009  1. Severe single-vessel coronary artery disease.       a.     Non-ST elevation myocardial infarction/drug-eluting stenting        of circumflex, secondary to subtotal in-stent restenosis, March        2007.       b.     Non-ST elevation myocardial infarction/bare-metal stenting        of subtotal circumflex, October 2006.       c.     Ejection fraction 55%.       d.     Negative exercise Cardiolite; ejection fraction 65%, May        2008.   2. Hypertension.   3. Dyslipidemia.       a.     Zocor intolerant.   4. Obesity.   5. Chronic neck pain. Acute GI bleed  Past Surgical History:  Last updated: 07/18/2008 Unremarkable  Family History: Last updated: 07/18/2008 Family History of Coronary Artery Disease:   Social History: Last updated: 07/18/2008 Tobacco Use - No.  Alcohol Use - no  Risk Factors: Smoking Status: never (06/09/2010)  Vital Signs:  Patient profile:   75 year old female Height:      62.5 inches Weight:      201 pounds BMI:     36.31 Pulse rate:   76 / minute BP sitting:   142 / 82  (left arm) Cuff size:   large  Vitals Entered By: Carlye Grippe (June 09, 2010 11:27 AM)  Nutrition Counseling: Patient's BMI is greater than 25 and therefore counseled on weight management options.  Physical Exam  Additional Exam:  General: Well-developed, well-nourished in no distress head: Normocephalic and atraumatic eyes PERRLA/EOMI intact,  conjunctiva and lids normal nose: No deformity or lesions mouth normal dentition, normal posterior pharynx neck: Supple, no JVD.  No masses, thyromegaly or abnormal cervical nodes lungs: Normal breath sounds bilaterally without wheezing.  Normal percussion heart: regular rate and rhythm with normal S1 and S2, no S3 or S4.  PMI is normal.  No pathological murmurs abdomen: Normal bowel sounds, abdomen is soft and nontender without masses, organomegaly or hernias noted.  No hepatosplenomegaly musculoskeletal: Back normal, normal gait muscle strength and tone normal pulsus: Pulse is normal in all 4 extremities Extremities: No peripheral pitting edema neurologic: Alert and oriented x 3 skin: Intact without lesions or rashes cervical nodes: No significant adenopathy psychologic: Normal affect    Impression & Recommendations:  Problem # 1:  CORONARY ARTERY DISEASE, S/P PTCA (ICD-414.9) single vessel coronary disease: Status post drug-eluting stent of the circumflex with Dr. Rose Phi restenosis 2007.  The patient will need bladder surgery.  She can come off Plavix one week prior to her procedure and restart as soon as possible. normal ejection fraction low risk Cardiolite 2010 Her updated medication list for this problem includes:    Plavix 75 Mg Tabs (Clopidogrel bisulfate) .Marland Kitchen... Take 1 tablet by mouth once a day    Aspirin 81 Mg Tbec (Aspirin) .Marland Kitchen... Take 1 tablet by mouth once a day    Toprol Xl 50 Mg Xr24h-tab (Metoprolol succinate) .Marland Kitchen... Take 1 tablet by mouth once a day    Lisinopril-hydrochlorothiazide 20-25 Mg Tabs (Lisinopril-hydrochlorothiazide) .Marland Kitchen... Take 1 tablet by mouth once a day    Nitrostat 0.4 Mg Subl (Nitroglycerin) .Marland Kitchen... Dissolve one tablet under tongue for severe chest pain as needed every 5 minutes, not to exceed 3 in 15 min time frame  Orders: EKG w/ Interpretation (93000)  Problem # 2:  ESSENTIAL HYPERTENSION, BENIGN (ICD-401.1) Assessment: Comment Only  Her updated  medication list for this problem includes:    Aspirin 81 Mg Tbec (Aspirin) .Marland Kitchen... Take 1 tablet by mouth once a day    Toprol Xl 50 Mg Xr24h-tab (Metoprolol succinate) .Marland Kitchen... Take 1 tablet by mouth once a day    Lisinopril-hydrochlorothiazide 20-25 Mg Tabs (Lisinopril-hydrochlorothiazide) .Marland Kitchen... Take 1 tablet by mouth once a day  Problem # 3:  GI BLEEDING (ICD-578.9) history of GI bleed : Tolerating Plavix.  Plavix needs to be discontinued if the patient requires epidural injection T7 vertebral compression fracture status post epidural injections.  Patient Instructions: 1)  Your physician recommends that you continue on your current medications as directed. Please refer to the Current Medication list given to you today. 2)  Your physician wants you to follow-up in: 1 year.  You will receive a reminder  letter in the mail about two months in advance. If you don't receive a letter, please call our office to schedule the follow-up appointment.

## 2010-06-22 ENCOUNTER — Other Ambulatory Visit: Payer: Self-pay | Admitting: Obstetrics and Gynecology

## 2010-06-22 ENCOUNTER — Encounter (HOSPITAL_COMMUNITY): Payer: Medicare Other | Attending: Obstetrics and Gynecology

## 2010-06-22 DIAGNOSIS — Z01812 Encounter for preprocedural laboratory examination: Secondary | ICD-10-CM | POA: Insufficient documentation

## 2010-06-22 LAB — CBC
HCT: 37 % (ref 36.0–46.0)
Hemoglobin: 12.3 g/dL (ref 12.0–15.0)
MCH: 26.1 pg (ref 26.0–34.0)
MCHC: 33.2 g/dL (ref 30.0–36.0)
MCV: 78.6 fL (ref 78.0–100.0)
Platelets: 174 10*3/uL (ref 150–400)
RBC: 4.71 MIL/uL (ref 3.87–5.11)
RDW: 15.2 % (ref 11.5–15.5)
WBC: 6.9 10*3/uL (ref 4.0–10.5)

## 2010-06-22 LAB — COMPREHENSIVE METABOLIC PANEL
ALT: 14 U/L (ref 0–35)
AST: 18 U/L (ref 0–37)
Albumin: 3.9 g/dL (ref 3.5–5.2)
Alkaline Phosphatase: 67 U/L (ref 39–117)
BUN: 18 mg/dL (ref 6–23)
CO2: 31 mEq/L (ref 19–32)
Calcium: 9.1 mg/dL (ref 8.4–10.5)
Chloride: 100 meq/L (ref 96–112)
Creatinine, Ser: 0.63 mg/dL (ref 0.4–1.2)
GFR calc Af Amer: >60 mL/min (ref >60)
GFR calc non Af Amer: >60 mL/min (ref >60)
Glucose, Bld: 86 mg/dL (ref 70–99)
Potassium: 3.3 mEq/L — ABNORMAL LOW (ref 3.5–5.1)
Sodium: 138 meq/L (ref 135–145)
Total Bilirubin: 0.6 mg/dL (ref 0.3–1.2)
Total Protein: 6.4 g/dL (ref 6.0–8.3)

## 2010-06-22 LAB — SURGICAL PCR SCREEN
MRSA, PCR: NEGATIVE
Staphylococcus aureus: NEGATIVE

## 2010-06-22 LAB — ABO/RH: ABO/RH(D): A NEG

## 2010-06-23 NOTE — H&P (Signed)
  [name], WALLNER              ACCOUNT NO.:  [id number]  MEDICAL RECORD NO.:  [id number]           PATIENT TYPE:  LOCATION:                                 FACILITY:  PHYSICIAN:  Tilda Burrow, M.D. DATE OF BIRTH:  Jan 09, 1936  DATE OF ADMISSION:  06/25/2010 DATE OF DISCHARGE:  LH                             HISTORY & PHYSICAL   ADMITTING DIAGNOSES:  Second-degree uterine descensus, cystocele, and small rectocele.  HISTORY OF PRESENT ILLNESS:  The patient is a 75 year old female postmenopausal, was admitted for vaginal hysterectomy with removal of tubes and ovaries, anterior and posterior repair.  She is a patient of Dr. Margo Common and sees Dr. Andee Lineman in [place] for cardiology concerns.  She has a second-degree uterine descensus with large cystocele.  She desires surgical correction.  She has been evaluated in our office, has had most recent Pap smear normal.  She had an ultrasound which shows the uterus to be small, anterior with only a 2-mm endometrial stripe.  The patient denies significant stress incontinence.  She has minimal rectocele complaints, only occasionally having difficulty with defecation.  She does have a small anatomic defect which will be corrected on the posterior wall.  Pros and cons of ovarian preservation been discussed in detail with patient supports the recommended removal of tubes and ovaries, if they can be accessible.  The patient is aware that if they are not technically accessible without resulting to probable abdominal surgery that plans are to not pursue removal of tubes and ovaries.  PAST MEDICAL HISTORY:  Positive for hypertension and heart disease.  PAST SURGICAL HISTORY:  Includes a hysteroscopy D and C with removal of endometrial polyp in 2005.  This showed benign endometrium.  Surgical history is also positive for cholecystectomy in 1970, broken ankle in 1995.  Of note, the cystocele was present, but asymptomatic in 2005, but has  progressed.  Medical history notable for heart attack in 2006-2007.  She is allergic to PENICILLIN.  PHYSICAL EXAMINATION:  GENERAL:  Shows a large-framed Caucasian female. VITAL SIGNS:  Height 5 feet 2 inches, weight 201.6, blood pressure 130/70. HEENT:  Pupils equal, round, and reactive. NECK:  Supple. CHEST:  Clear to auscultation. ABDOMEN:  Nontender. EXTERNAL GENITALIA:  Relaxed introitus with large cystocele and uterine descensus to the introitus.  PLAN:  Vaginal hysterectomy, removal of tubes and ovaries if possible, anterior and posterior vaginal repair after preoperative bowel prep. Procedure to be performed on June 25, 2010, at 7:30 a.m.     Tilda Burrow, M.D.     JVF/MEDQ  D:  06/19/2010  T:  06/20/2010  Job:  [id number]  cc:   [facility]  Wyvonnia Lora Fax: [phone]  Learta Codding, MD,FACC [address] [place], [place] [address]  Electronically Signed by Christin Bach M.D. on 06/23/2010 01:32:32 PM

## 2010-06-25 ENCOUNTER — Other Ambulatory Visit: Payer: Self-pay | Admitting: Obstetrics and Gynecology

## 2010-06-25 ENCOUNTER — Inpatient Hospital Stay (HOSPITAL_COMMUNITY)
Admission: RE | Admit: 2010-06-25 | Discharge: 2010-06-26 | DRG: 743 | Disposition: A | Discharge status: Home / Self Care | Payer: Medicare Other | Source: Ambulatory Visit | Attending: Obstetrics and Gynecology | Admitting: Obstetrics and Gynecology

## 2010-06-25 DIAGNOSIS — Z9861 Coronary angioplasty status: Secondary | ICD-10-CM

## 2010-06-25 DIAGNOSIS — I10 Essential (primary) hypertension: Secondary | ICD-10-CM | POA: Diagnosis present

## 2010-06-25 DIAGNOSIS — I252 Old myocardial infarction: Secondary | ICD-10-CM

## 2010-06-25 DIAGNOSIS — N814 Uterovaginal prolapse, unspecified: Principal | ICD-10-CM | POA: Diagnosis present

## 2010-06-25 DIAGNOSIS — I2510 Atherosclerotic heart disease of native coronary artery without angina pectoris: Secondary | ICD-10-CM | POA: Diagnosis present

## 2010-06-26 LAB — DIFFERENTIAL
Basophils Absolute: 0 10*3/uL (ref 0.0–0.1)
Basophils Relative: 0 % (ref 0–1)
Eosinophils Absolute: 0.3 10*3/uL (ref 0.0–0.7)
Eosinophils Relative: 3 % (ref 0–5)
Lymphocytes Relative: 21 % (ref 12–46)
Lymphs Abs: 1.9 10*3/uL (ref 0.7–4.0)
Monocytes Absolute: 0.8 10*3/uL (ref 0.1–1.0)
Monocytes Relative: 9 % (ref 3–12)
Neutro Abs: 6 10*3/uL (ref 1.7–7.7)
Neutrophils Relative %: 67 % (ref 43–77)

## 2010-06-26 LAB — CBC
HCT: 30.7 % — ABNORMAL LOW (ref 36.0–46.0)
Hemoglobin: 10.2 g/dL — ABNORMAL LOW (ref 12.0–15.0)
MCH: 25.5 pg — ABNORMAL LOW (ref 26.0–34.0)
MCHC: 33.2 g/dL (ref 30.0–36.0)
MCV: 76.8 fL — ABNORMAL LOW (ref 78.0–100.0)
Platelets: 146 10*3/uL — ABNORMAL LOW (ref 150–400)
RBC: 4 MIL/uL (ref 3.87–5.11)
RDW: 15.3 % (ref 11.5–15.5)
WBC: 9 10*3/uL (ref 4.0–10.5)

## 2010-06-26 LAB — BASIC METABOLIC PANEL
BUN: 12 mg/dL (ref 6–23)
CO2: 29 meq/L (ref 19–32)
Calcium: 8.4 mg/dL (ref 8.4–10.5)
Chloride: 102 meq/L (ref 96–112)
Creatinine, Ser: 0.71 mg/dL (ref 0.4–1.2)
GFR calc Af Amer: >60 mL/min (ref >60)
GFR calc non Af Amer: >60 mL/min (ref >60)
Glucose, Bld: 118 mg/dL — ABNORMAL HIGH (ref 70–99)
Potassium: 3.6 meq/L (ref 3.5–5.1)
Sodium: 137 mEq/L (ref 135–145)

## 2010-06-26 NOTE — Op Note (Signed)
[name], [name]              ACCOUNT NO.:  [id number]  MEDICAL RECORD NO.:  [id number]           PATIENT TYPE:  I  LOCATION:  A331                          FACILITY:  [facility]  PHYSICIAN:  Tilda Burrow, M.D. DATE OF BIRTH:  09-Oct-1935  DATE OF PROCEDURE: DATE OF DISCHARGE:                              OPERATIVE REPORT   PREOPERATIVE DIAGNOSES:  Second-degree uterine descensus, cystocele, rectocele.  POSTOPERATIVE DIAGNOSES:  Second-degree uterine descensus, cystocele, rectocele.  PROCEDURE:  Vaginal hysterectomy, anterior and posterior repair.  SURGEON:  Tilda Burrow, MD  ASSISTANT:  Lucrezia Europe, RNFA  ANESTHESIA:  Spinal.  FINDINGS:  Huge cystocele, small uterus, normal vaginal epithelium.  DISPOSITION OF SPECIMEN:  Pathology.  ESTIMATED BLOOD LOSS:  100 mL.  COMPLICATIONS:  None.  The patient to PACU in good condition.  DETAILS OF PROCEDURE:  The patient was taken to the operating room, prepped and draped for vaginal procedure with spinal anesthesia introduced.  Legs placed in the high lithotomy position and time-out conducted and procedure confirmed by all involved parties.  A peripheral injection of Marcaine with epinephrine was performed all around the cervix, and a circumferential incision made through the vaginal epithelium surrounding the cervix.  Posterior colpotomy incision was performed identifying the cul-de-sac.  The bladder flap was bluntly and sharply dissected free anteriorly and elevated.  The uterosacral ligaments on each side were quite strong, were clamped, cut, and suture ligated, tagged using 0 chromic, Zeppelin clamps, and Mayo scissors. The short weighted speculum was placed in the posterior vaginal wall to allow visualization.  Lateral sidewall retractors were used as needed. The lower cardinal ligaments were clamped, cut, and suture ligated using similar technique, and the upper cardinal ligaments then treated similarly.  The  anterior vesicouterine peritoneum had been opened at this time and we proceeded up the broad ligament marching, clamping, cutting, and suture ligating on each side in similar fashion with good hemostasis.  Uterus was removed, pedicles inspected.  The ovaries could be visualized on either side, were tiny atrophic with no visible abnormalities.  Due to the patient's body habitus, the technical challenges of ovarian removal was felt to exceed potential benefits, so as discussed with the patient before surgery the ovaries were left in situ.  The uterosacral ligaments were sutured to some of the very relaxed cul- de-sac tissues with a 2-0 Prolene suture on either side being careful to stay in the medial aspects of the uterosacral ligament staying close to the pedicle and so from lateral to medial.  The anterior peritoneum was then identified from the vesicouterine fold, attached to the posterior cul-de-sac, and the peritoneum closed.  Sponge and needle counts were correct.  Anterior repair consisted of injecting 10 mL of Marcaine beneath the cystoceles overlying epithelium, dissecting it free, trimming redundant vaginal epithelium, and then pulling the remaining tissues together in the midline with 0 Vicryl sutured underneath the epithelium and then tacking the trimmed vaginal mucosa up to the posterior vaginal epithelium at the site of the original colpotomy.  This resulted in significantly improved bladder support.  Foley catheter had been placed prior to this anterior repair  and approximately 150 mL of clear urine obtained.  Posterior repair then followed grasping the posterior introitus tissues injecting Marcaine again beneath the epithelium, dissecting a small window through the midline posterior vaginal epithelium, dissecting laterally just beneath the epithelium sufficiently to identify good supportive tissues on either side which were then sutured into the midline with a series  of three sutures of 0 Vicryl.  This resulted in good perineal body reinforcement and thickening and then the epithelium was tacked back down.  EBL was 100 mL for the total case.  Vaginal packing with Betadine-soaked gauze was placed and then the patient allowed to go to the recovery room in good condition.  Flowtron will placed for DVT prophylaxis in recovery.     Tilda Burrow, M.D.     JVF/MEDQ  D:  06/25/2010  T:  06/25/2010  Job:  [id number]  Electronically Signed by Christin Bach M.D. on 06/26/2010 01:45:39 PM

## 2010-06-29 LAB — CROSSMATCH
ABO/RH(D): A NEG
Antibody Screen: NEGATIVE
Unit division: 0
Unit division: 0

## 2010-07-05 NOTE — Discharge Summary (Signed)
  [name], [name]              ACCOUNT NO.:  [id number]  MEDICAL RECORD NO.:  [id number]           PATIENT TYPE:  I  LOCATION:  A331                          FACILITY:  [facility]  PHYSICIAN:  Tilda Burrow, M.D. DATE OF BIRTH:  [birth year]  DATE OF ADMISSION:  06/25/2010 DATE OF DISCHARGE:  03/16/2012LH                              DISCHARGE SUMMARY   ADMITTING DIAGNOSES: 1. Second-degree uterine descensus. 2. Acute cystocele. 3. Small rectocele. 4. History of coronary artery stents.  OTHER ADMITTING DIAGNOSES: 1. Chronic hypertension, stable. 2. Coronary artery disease, stable.  HOSPITAL SUMMARY:  This 75 year old female with symptomatic cystocele and uterine descensus, was admitted for vaginal hysterectomy.  She is the patient of Dr. Wyvonnia Lora, with cardiologist being Dr. Andee Lineman of [facility].  MEDICAL HISTORY:  Stable and well managed with medication list as follows: 1. Plavix 75 mg p.o. daily. last dose 1 week before surgery 2. Pravachol 80 mg 1 p.o. daily. 3. Aspirin 81 mg p.o. daily. 4. Toprol-XL 50 mg once daily. 5. Lisinopril/HCTZ 20/25 one tablet daily. 6. Fish oil 1000 mg 2 tablets daily. 7. Calcium 500 mg by mouth 2 times daily. 8. Vitamin C 1 tablet by mouth daily. 9. Nitrostat 0.4 mg sublingual p.r.n. chest pain. 10.Zantac 75 mg p.o. daily.  ALLERGIES:  PENICILLIN from childhood, the patient specifically can take  cephalosporins.  HOSPITAL COURSE:  The patient was admitted with a hemoglobin of 12.3, hematocrit 37, white count 6900, platelets 174,000.  CMP was notable for a potassium 3.3, glucose 86, BUN 18, creatinine 0.63.  GFR greater than 60.  Normal liver function tests.  The patient underwent vaginal hysterectomy, anterior and posterior repair with 100 mL blood loss. There was some postoperative oozing associated with the procedure. During the first few hours postprocedure, the patient was quite comfortable.  She remained afebrile,  tolerated regular diet.  Followup labs the next morning included potassium 3.6, glucose mildly elevated at 118 with a glucose infusion, BUN 12, creatinine 0.71, hemoglobin 10.2, hematocrit 30.7.  She remained afebrile with vital signs, pulse in the 60s, blood pressures in the 102-106/60 diastolic with O2 sats in the 96- 98 range on room air.  The patient tolerated regular diet.  Following day, had Foley catheter and packing removed and will be discharged home when she is able to void.  Only medicine will be added to her regimen is MiraLax 17 g p.o. daily and MetroGel half applicator per vagina 3-4 times weekly.  Followup in 2 weeks with routine postsurgical instructions given.  The patient has resumed her Plavix today postop day #1 and will continue care through Dr. Andee Lineman and Dr. Margo Common.     Tilda Burrow, M.D.     JVF/MEDQ  D:  06/26/2010  T:  06/26/2010  Job:  [id number]  cc:   Wyvonnia Lora Fax: [phone]  Learta Codding, MD,FACC [address] [place], [place] [address]  [facility]  Electronically Signed by Christin Bach M.D. on 07/05/2010 78:29:56 PM

## 2010-07-15 ENCOUNTER — Other Ambulatory Visit: Payer: Self-pay | Admitting: *Deleted

## 2010-07-15 MED ORDER — CLOPIDOGREL BISULFATE 75 MG PO TABS: 75.0000 mg | ORAL_TABLET | Freq: Every day | ORAL | Status: DC

## 2010-08-25 NOTE — Assessment & Plan Note (Signed)
[facility]                          [facility] OFFICE NOTE   NAME:[name], [name]                     MRN:          [id number]  DATE:01/26/2008                            DOB:          06/21/1935    PRIMARY CARDIOLOGIST:  Learta Codding, MD,FACC   REASON FOR VISIT:  Scheduled followup.   Ms. Weimer denies any interim development of signs/symptoms suggestive  of unstable angina pectoris, since her last visit here 1 year ago.  At  that time, no medication adjustments were made.   Clinically, Ms. Uno specifically denies any symptoms of exertion -  induced chest discomfort.  She does, however, complain of shortness of  breath when climbing a flight of stairs, but states that this is  unchanged from her baseline.   Ms. Mallinger does present with uncontrolled hypertension.  She states  that she took her medications earlier this morning.  She does not check  an ambulatory blood pressure on a regular basis.   Ms. Tates states that she had a fasting lipid profile drawn earlier  this year.   EKG today indicates NSR 75 bpm with normal axis and no acute changes.   CURRENT MEDICATIONS:  1. Toprol-XL 50 daily.  2. Plavix.  3. Aspirin 81 daily.  4. Lisinopril 20/25.  5. Pravastatin 40 daily.  6. Fish oil 1000 daily.   PHYSICAL EXAMINATION:  VITAL SIGNS:  Initial blood pressure 194/89;  repeat 160/78 right; 185/87 left.  The pulse 77, regular weight 218.  GENERAL:  A 75 year old female, obese, sitting upright, no distress.  HEENT:  Normocephalic and atraumatic.  NECK:  Palpable carotid pulses without bruits.  No JVD.  LUNGS:  Clear to auscultation in all fields.  HEART:  Regular rate and rhythm.  No significant murmurs.  ABDOMEN:  Protuberant, nontender.  EXTREMITIES:  Palpable pedal pulses with no pedal edema.  Strong  bilateral radial pulse.  NEUROLOGIC:  No focal deficit.   IMPRESSION:  1. Coronary artery disease      a.      Non-STEMI/Taxus stenting circumflex artery, secondary to       subtotal ISR, March 2007.      b.     Non-STEMI/bare-metal stenting subtotal circumflex, October       2006.      c.     Residual noncritical coronary artery disease.      d.     Ejection 55%.      e.     Negative exercise stress Cardiolite; EF 65%, May 2008.  2. Hypertension, controlled.  3. Dyslipidemia.      a.     Zocor intolerant.  4. Obesity.  5. Chronic neck pain.   PLAN:  1. Add Norvasc 5 mg daily for more aggressive blood pressure control.      Followup blood pressure check in 1 week.  2. Request most recent lipid profile from Dr. Jackolyn Confer office.      Aggressive lipid management recommended with target LDL of 70 or      less, if possible.  3. Return clinic followup with myself and Dr.  DeGent in 4 months.      Gene Serpe, PA-C  Electronically Signed      Learta Codding, MD,FACC  Electronically Signed   GS/MedQ  DD: 01/26/2008  DT: 01/27/2008  Job #: [id number]   cc:   Wyvonnia Lora

## 2010-08-25 NOTE — Assessment & Plan Note (Signed)
[facility]                          [facility] OFFICE NOTE   [name], [name]                     MRN:          [id number]  DATE:12/27/2006                            DOB:          Oct 14, 1935    PRIMARY CARDIOLOGIST:  Learta Codding, MD,FACC   REASON FOR VISIT:  Scheduled clinic follow up.  Please refer to Dr.  Margarita Mail previous office note of May 14, for full details.   At that time, the patient was referred for further evaluation of  intermittent left sided neck pain, worrisome for possible anginal  equivalent, in light of her known coronary artery disease.  Dr. Andee Lineman  evaluated this with an exercise stress Cardiolite during which the  patient exercised for nearly six minutes, obtaining 92% of PMHR with no  associated chest discomfort nor diagnostic EKG changes.  Perfusion  imaging was normal and calculated ejection fraction was 65%.   The patient reports today that she is feeling great.  She has this  constant left sided neck pain, but this does not appear to be related to  any activities or exertion, and is clearly not exacerbated by it.  Of  note, she does recall having some chest tightness during each of her two  previous presentations with myocardial infarction, and she has not had  any such discomfort with activity or moderate exertion.   Of note, the patient started taking Pravachol as we had previously  recommended, and she is tolerating this quite nicely.  She was  previously intolerant of Zocor.  However, she has not had a lipid  profile since April of this year at which time she had a total  cholesterol of 167, triglycerides 61, HDL 68 and LDL 87.   CURRENT MEDICATIONS:  1. Toprol XL 50 daily.  2. Plavix.  3. Aspirin 81 mg daily.  4. Lisinopril 20/25 mg daily.  5. Pravastatin 40 mg q.h.s.   PHYSICAL EXAMINATION:  VITAL SIGNS:  Blood pressure 148/72, pulse 75,  weight 220 (up 7).  GENERAL:  A 75 year old female,  moderately obese, sitting upright in no  distress.  HEENT:  Normocephalic, atraumatic.  NECK:  Palpable bilateral carotid pulses without bruits.  No JVD at 90  degrees.  LUNGS:  Clear to auscultation in all fields.  HEART:  Regular rate and rhythm (S1, S2).  No significant murmurs.  No  rales.  ABDOMEN:  Protuberant, benign.  EXTREMITIES:  Trace pedal edema.  NEUROLOGICAL:  No focal deficits.   IMPRESSION:  1. Coronary artery disease, stable.      a.     Normal adequate exercise stress Cardiolite;  EF 65%, May       2008.      b.     Status post non-STEMI/TAXUS stenting left circumflex artery       in March 2007, secondary to subtotal in-stent restenosis.      c.     Status post non-STEMI/bare metal stenting subtotal left       circumflex artery in October 2006.      d.     Residual noncritical coronary artery  disease.      e.     Preserved left ventricular function (EF 55%); mild inferior       hypokinesis.  2. Dyslipidemia.  Currently tolerating Pravachol; Zocor intolerant.  3. Chronic left neck pain.  4. Hypertension.  5. Obesity.   PLAN:  1. Continue current medication regimen.  The patient will need to      remain on Plavix for at least two years, if not indefinitely, given      her history of in-stent restenosis.  2. Schedule return clinic follow up with myself and Dr. Andee Lineman in six      months.      Gene Serpe, PA-C  Electronically Signed      Learta Codding, MD,FACC  Electronically Signed   GS/MedQ  DD: 12/27/2006  DT: 12/28/2006  Job #: [id number]

## 2010-08-25 NOTE — Assessment & Plan Note (Signed)
[facility]                          [facility]   [name], [name] [name]                     MRN:          [id number]  DATE:08/24/2006                            DOB:          03/04/1936    REFERRING PHYSICIAN:  Wyvonnia Lora   HISTORY OF PRESENT ILLNESS:  The patient is a 75 year old female with  the history of single-vessel coronary artery disease.  The patient is  status post Taxus stent placement to a circumflex with repeat procedure,  secondary to restenosis.  She has preserved LV function and had a  negative Cardiolite study in August of 2007.  She had been doing quite  well up until a few weeks ago.  The patient stated that she has had  intermittent left-sided neck pain.  She was concerned that this could be  representative of her angina, as she had a similar presentation in the  past.  However, these episodes do appear to be slightly different in the  sense that the patient states that certain movements of her head make  the pain worse.  She also has noticed some bulging of her left neck  muscles.  She stated that there has been some tenderness.  She denies  any exertional chest pain.  She had particularly bad episodes Saturday  and Sunday.  She took some Motrin and it did appear to resolve her  discomfort.   The EKG today in the office demonstrates normal sinus rhythm, no acute  ischemic changes.   The patient stated that she had not tried to take nitroglycerin, as she  did not think about this.   MEDICATIONS:  1. Toprol XL 50 mg p.o. daily.  2. Lisinopril/hydrochlorothiazide 20/25 mg p.o. daily.  3. Plavix 75 mg p.o. daily.  4. Potassium 10 mEq two tablets b.i.d.  5. Ferrous sulfate.  6. Vitamin C 500 mg p.o. daily.   PHYSICAL EXAMINATION:  Blood pressure 170/82, heart rate 89 beats per  minute, weight is 270 pounds.  GENERAL:  Well-nourished white female, in no apparent distress.  HEENT:  Normal.  NECK EXAM:   Normal carotid upstroke, no carotid bruits.  LUNGS:  Clear breath sounds bilaterally.  HEART:  Regular rate and rhythm, normal S1, S2.  No murmurs, rubs or  gallops.  ABDOMEN:  Soft.  EXTREMITY EXAM:  No cyanosis, clubbing or edema.   PROBLEM LIST:  1. Coronary artery disease.      a.     Status post non-ST-elevation myocardial infarction with bare       metal stent to circumflex, December 2006.      b.     Non-ST-elevation myocardial infarction with Taxus stent to       subtotal in-stent restenosis, left circumflex, March 2007.      c.     Preserved LV function.      d.     Negative Cardiolite study, August 2007.      e.     Recurrent left-sided neck pain; rule out angina.  2. Rule out surgical stenosis/degenerative joint disease with possible      nerve  entrapment.  3. Anemia, status post previous transfusion.  Stable hemoglobin.  4. Dyslipidemia.  5. Hypertension.  6. Obesity.   PLAN:  1. The patient's neck pain is concerning, but somewhat atypical,      compared to prior episodes.  I have asked her to take nitroglycerin      if she has recurrent problems.  2. I have given the patient prescription for Imdur.  We will proceed      with exercise stress testing in the next couple of days.  3. Patient was also given a small dose prescription of Valium, as it      does appear that she has spasms of the left muscles.  I told her to      take this on a p.r.n. basis, but use it sparingly and only in the      evening.  4. The patient will follow up with [place] in the next couple of weeks.     Learta Codding, MD,FACC     GED/MedQ  DD: 08/24/2006  DT: 08/24/2006  Job #: [id number]   cc:   Wyvonnia Lora

## 2010-08-25 NOTE — Assessment & Plan Note (Signed)
[facility]                          [facility]   Brietta, Manso MOSSIE GILDER                     MRN:          [id number]  DATE:07/08/2008                            DOB:          1935-09-03    PRIMARY CARDIOLOGIST:  Learta Codding, MD, [facility]   REASON FOR VISIT:  Scheduled followup.   Ms. Elsayed reports no interim development of signs/symptoms suggestive  of unstable angina pectoris, since her last visit.  Until recently, she  was exercising regularly with a treadmill at home, but has had to stop  secondary to development of symptoms suggestive of positional vertigo.  She also underwent recent cataract surgery.  She has been referred to a  neurologist in [place], for further evaluation of her neurologic  symptoms.  She states that she has been diagnosed with vertigo in the  past.   The patient denies any exertion - induced anterior chest discomfort.  She recalls symptoms of heartburn prior to undergoing stenting on 2  separate occasions.  She has not had any such symptoms, since then.   Ms. Bergh has lost 2 pounds since her last visit.  Her blood pressure  also appears much better regulated.   CURRENT MEDICATIONS:  1. Plavix.  2. Aspirin 81 daily.  3. Pravachol 80 nightly.  4. Toprol-XL 50 daily.  5. Lisinopril 20/25 daily.  6. Fish oil 2 g b.i.d.   PHYSICAL EXAMINATION:  VITAL SIGNS:  Blood pressure 140/76, pulse 70,  regular weight 260 (down 2).  GENERAL:  A 75 year old female, sitting upright, no distress.  HEENT:  Normocephalic, atraumatic.  NECK:  Palpable carotid pulse without bruits; no JVD.  LUNGS:  Clear to auscultation in all fields.  HEART:  Regular rate and rhythm.  No significant murmurs.  ABDOMEN:  Protuberant.  EXTREMITIES:  Trace pedal edema.  NEURO:  No focal deficit.   IMPRESSION:  1. Severe single-vessel coronary artery disease.      a.     Non-ST elevation myocardial infarction/drug-eluting  stenting       of circumflex, secondary to subtotal in-stent restenosis, March       2007.      b.     Non-ST elevation myocardial infarction/bare-metal stenting       of subtotal circumflex, October 2006.      c.     Ejection fraction 55%.      d.     Negative exercise Cardiolite; ejection fraction 65%, May       2008.  2. Hypertension.  3. Dyslipidemia.      a.     Zocor intolerant.  4. Obesity.  5. Chronic neck pain.   PLAN:  1. Await scheduled followup of fasting lipid/liver profile next month,      per Dr. Margo Common.  Previous studies showed good control with LDLs in      the low 80 range.  However, we continue to recommend aggressive      management with target LDL of 70 or less, if possible.  2. Scheduled return clinic followup with myself and Dr. Andee Lineman in 6  months.      Gene Serpe, PA-C  Electronically Signed      Learta Codding, MD,FACC  Electronically Signed   GS/MedQ  DD: 07/08/2008  DT: 07/09/2008  Job #: [id number]   cc:   Wyvonnia Lora

## 2010-08-28 NOTE — Assessment & Plan Note (Signed)
[facility]                            [facility]   NAME:[name], [name]                     MRN:          [id number]  DATE:11/25/2005                            DOB:          [birth year]    No dictation for this job.                                   Learta Codding, MD, [facility]   GED/MedQ  DD:  11/25/2005  DT:  11/25/2005  Job #:  [id number]

## 2010-08-28 NOTE — Cardiovascular Report (Signed)
NAMEBRYLEA, PITA NO.:  [id number]   MEDICAL RECORD NO.:  [id number]          PATIENT TYPE:  INP   LOCATION:  2928                         FACILITY:  [facility]   PHYSICIAN:  Arvilla Meres, M.D. [facility] OF BIRTH:  06/28/1935   DATE OF PROCEDURE:  06/11/2005  DATE OF DISCHARGE:                              CARDIAC CATHETERIZATION   PRIMARY CARE PHYSICIAN:  Wyvonnia Lora, M.D., [place].   CARDIOLOGIST:  Learta Codding, M.D.   PATIENT IDENTIFICATION:  [name] is a 75 year old woman with known  coronary artery disease, who suffered a non-ST elevation myocardial  infarction in October 2006.  At that time, she was found to have a 99%  stenosis in her mid left circumflex.  This was angioplastied and stented  with a bare metal stent by Dr. Charlies Constable.  She was doing fairly well  until several days ago, when she developed chest pain at rest radiating to  her throat with shortness of breath.  This is very similar to her previous  angina.  She was admitted and had mildly elevated cardiac markers with a  troponin of 0.27.  She was thus referred for cardiac catheterization.   PROCEDURES PERFORMED:  1.  Selective coronary angiography.  2.  Left heart catheterization.  3.  Left ventriculogram.   DESCRIPTION OF PROCEDURE:  The risks and benefits of catheterization were  explained [name].  Consent was signed and placed on the chart.  A 6-  French sheath was placed in right femoral artery using a modified Seldinger  technique.  Standard preformed Judkins catheters including JL-4, JR-4 and  angled pigtail, were used for the procedure.  All catheter exchanges made  over wire.  There were no apparent complications.   Central aortic pressure was 158/80 with a mean of 115. LV pressure was  148/10 with an EDP of 22.  There is no aortic stenosis.   Left main was normal.   LAD was a long vessel wrapping the apex.  It gave off two moderate-sized  diagonals and a small third diagonal.  There is a 40% stenosis in the  proximal section just after the first diagonal.  In the midsection after the  second diagonal, there was 40-50% stenosis and distally mid to distally  there was a 50-60% stenosis.   Left circumflex was a large vessel.  It gave off a large OM-1, a small OM-2  and two moderate-size posterolaterals.  There is a 40-50% ostial stenosis of  left circumflex.  There was a previously-placed stent in the midsection of  the LAD after the OM-1.  There was a 99% in-stent restenosis.   Right coronary artery was a moderate-sized, dominant vessel that gave off an  RV branch, a moderate-sized PDA and two small PLs.  There is a 40% proximal  lesion.   Left ventriculogram done the RAO position showed an EF of 55% with mild  inferior hypokinesis.   ASSESSMENT:  1.  Native vessel coronary disease as described above, now with small non-ST      elevation myocardial infarction.  Catheterization shows 99%  in-stent      restenosis of the stent in the left circumflex and nonobstructive      disease elsewhere.  2.  A low normal ejection fraction with mildly elevated end-diastolic      pressure.   DISCUSSION/PLAN:  I have reviewed the films with Dr. Bonnee Quin.  The plan  will be for a repeat intervention on the left circumflex in-stent  restenosis.  She will also need aggressive risk factor modification.      Arvilla Meres, M.D. [facility]  Electronically Signed     DB/MEDQ  D:  06/11/2005  T:  06/12/2005  Job:  [id number]   cc:   Wyvonnia Lora  Fax: [phone]   Learta Codding, M.D. [facility]  [address]  [address]  [place]  [place] [address]

## 2010-08-28 NOTE — Cardiovascular Report (Signed)
NAMEELONNA, MCFARLANE              ACCOUNT NO.:  [id number]   MEDICAL RECORD NO.:  [id number]          PATIENT TYPE:  INP   LOCATION:  2921                         FACILITY:  [facility]   PHYSICIAN:  Charlies Constable, M.D. [facility] DATE OF BIRTH:  15-Sep-1935   DATE OF PROCEDURE:  01/21/2005  DATE OF DISCHARGE:                              CARDIAC CATHETERIZATION   PROCEDURE:  Cardiac catheterization and percutaneous coronary intervention  note.   CLINICAL HISTORY:  Ms. Galas is 75 years old and she has had no prior  history of heart disease.  She does have hypertension and a positive family  history for coronary heart disease.  She was seen by Dr. Andee Lineman in  consultation for symptoms of dyspnea and chest tightness and she was  hospitalized and found to have ST elevation consistent with a non-ST  elevation infarction.  She was transferred to [facility] and  scheduled for angiography this morning.   DESCRIPTION OF PROCEDURE:  The procedure was performed via the right femoral  artery utilizing an arterial sheath and 6-French preformed coronary  catheters. A front wall arterial puncture was performed, and Omnipaque  contrast was used. __________ diagnostic study made an incision and  proceeded with intervention on the circumflex artery.   The patient had been given 300 mg of Plavix, earlier, and she was given an  additional 300 mg Plavix load.  We used Angiomax bolus and infusion.  We  used a CLS 3.5 guiding catheter, a 6-French.  We were able to cross the  lesion in the proximal-to-mid circumflex artery with a Prowater wire without  difficulty.  We predilated with a 2.5 x 15 mm Maverick performing 2  inflations up to 10 atmospheres for 30 seconds.  We then deployed a 3.5 x 15  mm Vision stent deploying this with 1 inflation of 12 atmospheres for 30  seconds.  We postdilated with a 4.0 x 12 mm Quantum Maverick performing 1  inflation up to 16 atmospheres x 30 seconds.  Final diagnostic  studies were  then performed through the guiding catheter.  The patient tolerated the  procedure well, and left the laboratory in satisfactory condition.   RESULTS:  The aortic pressure was 137/59 with mean of 99.  Left ventricular  pressure was 137/16.   LEFT MAIN CORONARY ARTERY:  The Left main coronary artery was free of  significant disease.   LEFT ANTERIOR DESCENDING ARTERY:  Left anterior descending artery gave rise  to two diagonal branches and two septal perforators and a small third  diagonal branch.  There was 40% narrowing in the proximal LAD and there was  40 and 50-60% lesions in the mid-LAD.   CIRCUMFLEX ARTERY:  The circumflex artery gave rise to an atrial branch, a  first marginal branch, a second marginal branch, and three posterolateral  branches. There was 40% ostial stenosis in the circumflex artery.  There was  95% stenosis in the proximal-to-mid vessel.  There was also 30% ostial  narrowing of the first marginal branch.   RIGHT CORONARY ARTERY:  The right coronary artery is a moderate-size vessel  that gave rise to two right ventricular branches, a posterior descending  branch, and two small posterolateral branches. There was 40% narrowing in  the proximal right coronary artery, after the first right ventricular  branch.   LEFT VENTRICULOGRAM:  The left ventriculogram performed in the RAO  projection showed hypokinesis of the mid inferior wall. The apex and the  inferobasal wall moved well and the anterolateral moved well.  The estimated  ejection fraction was 55%.   Following stenting of the lesion in the mid circumflex artery, the stenosis  improved from 95% to less than 10%.   CONCLUSIONS:  1.  Coronary artery disease, status post recent non-ST-elevation  myocardial      infarction with 40% proximal and 50-60% mid stenosis in the left      anterior descending artery, 40% ostial and 95% mid stenosis in the      circumflex artery, 40% narrowing in the  proximal right coronary artery,      and mid inferior wall hypokinesis with an estimated ejection fraction of      55%.  2.  Successful PCI of the lesion in the mid circumflex artery using a Vision      bare metal stent with improvement of __________ narrowing from 95% to      less than 10%.   DISPOSITION:  The patient returned to the recovery room for further  observation.           ______________________________  Charlies Constable, M.D. [facility]     BB/MEDQ  D:  01/21/2005  T:  01/21/2005  Job:  [id number]   cc:   Wyvonnia Lora  Fax: [phone]   Learta Codding, M.D. [facility]  [address]  [address]  [place]  [place] [address]   Cardiopulmonary Lab

## 2010-08-28 NOTE — Assessment & Plan Note (Signed)
[facility]                          [facility]   [name], [name] White DEBRIA White                     MRN:          [id number]  DATE:05/24/2006                            DOB:          12-16-1935    PRIMARY CARDIOLOGIST:  Learta Codding, MD, [facility]   REASON FOR VISIT:  Scheduled 12-month followup.   Since last seen here in the clinic in August 2007 by Dr. Andee Lineman, the  patient reports that she was just recently briefly hospitalized at  [facility] one month ago with acute melena.  She denied any overt bleeding  but did present with a hemoglobin of 10.1 which dropped to 9.3.  She was  transfused with 1 unit packed red blood cells with subsequent  improvement in hemoglobin level.  She underwent evaluation consisting of  upper endoscopy which was normal. The patient also reported a recent  negative colonoscopy.  The patient also had a small-bowel follow-through  with results currently unavailable.   Regarding medications, both Plavix and aspirin were initially placed on  hold, but Plavix was resumed at time of discharge with recommendation to  continue holding aspirin.   Since her discharge, the patient has not had any recurrent melena.  She  has been placed on iron supplementation and is due to have followup CBC  in the next few weeks.   From a clinical standpoint, the patient has had no interim development  of symptoms suggestive of unstable angina pectoris.  When last seen, she  was referred for an exercise stress Cardiolite which was adequate and  negative for evidence of ischemia; EF 67%.   Of note, the patient has since stopped taking Zocor secondary to  development of lower extremity myalgia.  She also seems to feel that she  has never had a problem with cholesterol and, therefore, does not  understand why she would need to be on a statin at all.   The patient also describes a mid scapular pain that has nagged her for  several years but  which preceded her myocardial infarction and which has  been unchanged since undergoing percutaneous intervention.   CURRENT MEDICATIONS:  1. Toprol XL 50 daily.  2. Lisinopril/hydrochlorothiazide 20/25 daily.  3. Plavix.  4. Fergon 1-2 tablets a day.  5. Vitamin C 500 a day.  6. Potassium 20 mEq b.i.d.   PHYSICAL EXAMINATION:  VITAL SIGNS:  Blood pressure 144/72, pulse 72 and  regular.  Weight 212.8 (up 5 pounds).  GENERAL:  A 75 year old female, mildly obese, sitting upright, in no  distress.  HEENT: Normocephalic and atraumatic.  NECK:  Comparable bilateral carotid pulses without bruits; no JVD.  LUNGS:  Clear to auscultation in all fields.  CARDIAC:  Regular rate and rhythm (S1, S2).  No significant murmurs.  ABDOMEN:  Protuberant, nontender.  EXTREMITIES:  Trace pedal edema.  NEUROLOGIC: No focal deficits.   IMPRESSION:  1. Coronary artery disease.      a.     Status post non-ST-elevation myocardial infarction with bare       metal stenting subtotal mid circumflex artery October 2006.  b.     Non-ST-elevation myocardial infarction, TAXUS stenting       subtotal in-stent restenosis left circumflex artery March 2007.      c.     Preserved left ventricular function.      d.     Negative adequate exercise stress Cardiolite August 2007.  2. Anemia with recent melena.      a.     Requiring transfusion.      b.     Normal upper endoscopy.  3. History of epistaxis.  4. Hyperlipidemia, ZOCOR intolerant.  5. Hypertension.  6. Obesity.   PLAN:  1. Agree with recent recommendation to continue Plavix but to hold      aspirin in light of the recent significant anemia requiring      transfusion.  Ideally, we would like to have the patient remain on      Plavix for a full 2 years following placement of a drug-eluting      stent.  2. Patient instructed to continue closely monitoring her lipid profile      and to consider resuming a different cholesterol-lowering agent       following consultation with Dr. Margo Common.  Recommended LDL goal is 70      or lower.  3. Schedule return clinic followup with myself and Dr. Andee Lineman in 6      months.      Gene Serpe, PA-C  Electronically Signed      Learta Codding, MD,FACC  Electronically Signed   GS/MedQ  DD: 05/24/2006  DT: 05/24/2006  Job #: [id number]   cc:   Wyvonnia Lora

## 2010-08-28 NOTE — Op Note (Signed)
NAME:  [name], [name]                        ACCOUNT NO.:  [id number]   MEDICAL RECORD NO.:  [id number]                   PATIENT TYPE:  AMB   LOCATION:  DAY                                  FACILITY:  [facility]   PHYSICIAN:  Tilda Burrow, M.D.              DATE OF BIRTH:  March 24, 1936   DATE OF PROCEDURE:  DATE OF DISCHARGE:                                 OPERATIVE REPORT   PREOPERATIVE DIAGNOSES:  Postmenopausal bleeding, suspected endometrial  polyp.   POSTOPERATIVE DIAGNOSES:  Postmenopausal bleeding, suspected endometrial  polyp.   PROCEDURE:  Hysteroscopy, dilation and curettage with removal of endometrial  polyp.   SURGEON:  Tilda Burrow, M.D.   ASSISTANTNadean Corwin, CST, FA   ANESTHESIA:  General endotracheal intubation.   COMPLICATIONS:  None.   ESTIMATED BLOOD LOSS:  50 mL   FINDINGS:  Large endometrial polyp 2-3 cm diameter. Small areas of irregular  endometrial tissue thoroughly biopsied.  Uterus sounding to 70 cm, generous  bleeding associated with dilation and initial procedure.   DESCRIPTION OF PROCEDURE:  The patient was taken to the operating room,  prepped and draped for a vaginal procedure.  Findings also included a large  bulging cystocele to the introitus.  Drainage of the bladder removed 50 mL  of residual urine.  The uterus was grasped with a single tooth tenaculum on  the anterior lip of the cervix. The uterus was sounded to 7 cm, dilated to  29 [place] and 30 degree hysteroscope introduced into the uterine cavity.  Photo #1 and #2 were obscured due to rather generous bleeding which made  technically difficult hysteroscopic visualization of the uterine cavity. We  were able to see some small shaggy areas and a suspected polyp anteriorly.  We then proceeded with removal of the hysteroscope grasping the endometrial  polyp with Randall stone forceps and with a twisting maneuver were able to  remove the polyp in one large piece. There were small  fragments of shaggy  irregular tissue which were then removed under direct hysteroscopic guidance  with the hysteroscopic biopsy forceps. We then proceeded to reinspect after  hemostasis was dramatically improved and the irregular fragments of the  residual endometrial cavity were trimmed with the hysteroscopic biopsy  instruments. Small fragments of the  tissue were collected as well as possible and sent as part of the specimen.  At the end of the procedure, the patient had minimal bleeding present and  then we considered the complete procedure a successfully completed procedure  and allowed her to go to the recovery room in good condition.      ___________________________________________                                            Tilda Burrow, M.D.   JVF/MEDQ  D:  06/13/2003  T:  06/13/2003  Job:  [id number]   cc:   Wyvonnia Lora  [address]  [place]  [place] [address]  Fax: [phone]

## 2010-08-28 NOTE — Assessment & Plan Note (Signed)
[facility]                            Christina CARDIOLOGY OFFICE White   [name], [name]                       MRN:          [id number]  DATE:11/25/2005                            DOB:          13-Mar-1936    HISTORY OF PRESENT ILLNESS:  The patient is a 75 year old female with  history of coronary artery disease, status post non ST elevation myocardial  infarction in October of 2006, status post drug alluding stent placement to  circumflex coronary artery in 2007 earlier this year.  The patient has been  doing well.  She was initially scheduled for 55-month follow-up.  The patient  , however, requested to be seen earlier as she was experiencing tingling  sensations in her chest.  She also feels that there is some tightness.  She  occasionally feels left-sided shoulder pain, although this does not appear  to occur on exertion.  She is concerned about these symptoms as they are  similar to her prior presentation in 2006.  She denies any orthopnea, PND,  palpitations, or syncope.  She feels that she puffs a lot.   MEDICATIONS:  1. Toprol XL 50 mg a day  2. Lisinopril/hydrochlorothiazide 20/25.  3. Zocor 200 mg p.o. q.h.s.  4. Protonix 75 mg a day.  5. Aspirin 81 mg a day.   PHYSICAL EXAMINATION:  VITAL SIGNS:  Blood pressure 120/60, heart rate 83.  NECK:  Normal carotid upstroke, no carotid bruits.  LUNGS:  Clear.  Breath sounds normal.  HEART:  Regular rate and rhythm.  Normal S1/S2.  No murmur, rubs, or  gallops.  ABDOMEN:  Soft.  EXTREMITIES:  No cyanosis, clubbing, or edema.   PROBLEM:  1. Coronary artery disease.      a.     Status post non ST elevation myocardial infarction, October       2006, with known drug eluting stent to the circumflex.      b.     In stent restenosis, March 2007, to circumflex coronary artery.      c.     Atypical chest pain.  2. Epistaxis.  3. Low normal ejection fraction 45-50%.  4. Hypertension.  5.  Dyslipidemia, LDL stable.   PLAN:  1. The patient will be scheduled for an exercise Cardiolite test to rule      out ischemia, although I think her symptoms are rather atypical.  2. The patient does report symptoms that could be consistent with sleep      apnea and I have ordered an apnea link.  We will follow the patient up      after  this __________.                                   Learta Codding, MD, [facility]   GED/MedQ  DD:  11/25/2005  DT:  11/25/2005  Job #:  [id number]

## 2010-08-28 NOTE — Cardiovascular Report (Signed)
[name], [name]:  [id number]   MEDICAL RECORD NO.:  [id number]          PATIENT TYPE:  INP   LOCATION:  2928                         FACILITY:  [facility]   PHYSICIAN:  Arturo Morton. Riley Kill, M.D. [facility] OF BIRTH:  06-15-1935   DATE OF PROCEDURE:  06/11/2005  DATE OF DISCHARGE:  06/13/2005                              CARDIAC CATHETERIZATION   INDICATIONS:  The patient is a very delightful 75 year old woman who  previously underwent stenting using the nondrug-eluting platform by Dr.  Juanda Chance on January 21, 2005. At that time, a 3.5 x 15 Multilink Vision was  utilized. The vessel was opened without difficulty. There was a nice  angiographic result. Unfortunately, the patient has developed recurrent  symptoms. She now has evidence of high-grade in-stent restenosis as well as  advancement of disease distal to the previously placed stent. We discussed  various options with the patient and family, and subsequently the patient  was brought back to the catheterization laboratory after being in the  holding area for percutaneous intervention. Diagnostic study was done by Dr.  Gala Romney.   PROCEDURE:  Percutaneous stenting of the circumflex coronary artery for in-  stent restenosis utilizing a 2.75 x 24 Taxus Express II drug-eluting stent  with post dilatation using a 3.25 Quantum Maverick balloon.   DESCRIPTION OF PROCEDURE:  The patient was brought to the catheterization  laboratory. She did have a modest hematoma around the previously placed  sheath. The 6-French sheath was exchanged for a 7-French sheath. The 7-  French sheath was then flushed. The patient was appropriately anticoagulated  with bivalirudin. She had previously had Plavix. We utilized a CLS 7-French  guiding catheter along with a Luge wire. We predilated the area of in-stent  restenosis. Subsequently, we passed a 28 mm length Taxus drug-eluting stent  and felt that this was too long. This was  subsequently removed. We then  placed a 24 mm x 2.75 Taxus drug-eluting stent and this was taken up to  approximately 14 atmospheres. Postdilatation was then carried out with a  3.25 Quantum Maverick balloon with dilatations up to 12 atmospheres  throughout the course of the stent. There was marked improvement in the  appearance of the artery. There was some pinching of the obtuse marginal  branch which was actually proximal to the stent site placement. This had  been previously somewhat narrowed. In addition, there was an ostial lesion  of about 40% noted prior to the procedure. This did not change after the  procedure. There was a reduction in stenosis from 90% to 0% at the stent  site and 70% to 0% distal to the stent with the one overlapping stent. There  were no complications.   CONCLUSION:  Successful percutaneous stenting of in-stent restenosis using a  Taxus drug-eluting platform.   DISPOSITION:  The patient will be continued on aspirin and Plavix at the  present time. The need for chronic Plavix has been explained to the patient  and her family. She will need follow-up in the clinic in [facility] with Dr.  Andee Lineman.      Arturo Morton.  Riley Kill, M.D. [facility]  Electronically Signed     TDS/MEDQ  D:  06/11/2005  T:  06/13/2005  Job:  [id number]   cc:   [name]  Fax: [phone]   [name], M.D. [facility]  [address]  [address]  [place]  [place] [address]   CV Laboratory   Patient's medical record

## 2010-08-28 NOTE — H&P (Signed)
[name], [name]                        ACCOUNT NO.:  [id number]   MEDICAL RECORD NO.:  [id number]                   PATIENT TYPE:  AMB   LOCATION:  DAY                                  FACILITY:  [facility]   PHYSICIAN:  Tilda Burrow, M.D.              DATE OF BIRTH:  1935-08-07   DATE OF ADMISSION:  DATE OF DISCHARGE:                                HISTORY & PHYSICAL   ADMISSION DIAGNOSES:  1. Postmenopausal bleeding.  2. Endometrial thickening, suspected endometrial polyp with negative     endometrial biopsy May 28, 2001.  3. Asymptomatic cystocele.   HISTORY OF PRESENT ILLNESS:  This 75 year old female postmenopausal x years  on Prempro 0.45/1.5 is admitted at this time for hysteroscopy and D&C. She  began to bleed May 16, 2003, was seen by Dr. Margo Common, her regular  physician, and then was seen in our office on March 26, 2004 where GYN  exam was notable for symptomatic cystocele without any urinary incontinence  symptoms, normal appearing vaginal entrance and vagina and cervix.  Endometrial biopsy was performed with uterus sounding to 9 cm showing scanty  tissue. There was a somewhat irregular sensation suggesting a polyp. Vaginal  ultrasound at the same time revealed a distinctly thickened endometrium  measuring 2.8 cm in transverse diameter by 1.3 cm in AP thickness.  It is  suspected that she has either hyperplastic process or, more likely, an  endometrial polyp. The uterine contour itself shows a normal 8 cm uterus  with transverse diameter of 7.3 cm, and AP thickness of 4.5 cm with a smooth  distinct endometrial separation from the myometrium. We have talked about  the possible etiologies to the endometrial thickening. Since she has been on  acyclic she has been on estrogen and progesterone. The possibility of benign  tissue buildup is considered most likely and polyp considered more likely.   The technical aspects of the recommended procedure have been  reviewed using  [employer] instructional booklet with specific review of potential complications  such as uterine perforation, cervical laceration, and need for additional  surgery such as diagnostic laparoscopy or overnight observation have been  specifically reviewed as has been the risk of anesthesia complications  listed at 1 in 20,000.  The patient acknowledges these risks as inherent  portions of having procedures done.   PAST MEDICAL HISTORY:  Positive for hypertension treated by Wyvonnia Lora,  M.D.   PAST SURGICAL HISTORY:  Cholecystectomy in 1970, injuries broken ankle 1995.   MEDICATIONS:  Prempro 0.45/1.5.   PHYSICAL EXAMINATION:  VITAL SIGNS:  Height 5 feet 2 inches, weight 218,  blood pressure 130/66.  GENERAL:  Shows a healthy, anxious, Caucasian female overweight, oriented  x3.  HEENT:  Pupils equal, round and reactive.  NECK:  Supple, normal thyroid.  CHEST:  Clear to auscultation.  ABDOMEN:  Without masses, obesity present.  PELVIC:  The external genitalia normal female.  Vaginal  exam normal  secretions.  Cervix nulliparous. Pap smear done through Dr. Margo Common was  normal in 2004. The uterus was anteflexed, 9 cm in length.  See HPI for  details of ultrasound. Adnexa negative for masses.  EXTREMITIES:  Grossly normal.   ASSESSMENT:  Endometrial thickening, postmenopausal bleeding, suspected  endometrial polyp.   PLAN:  Hysteroscopy D&C and removal of polyp on June 13, 2003.     ___________________________________________                                         Tilda Burrow, M.D.   JVF/MEDQ  D:  06/12/2003  T:  06/12/2003  Job:  [id number]   cc:   Wyvonnia Lora  [address]  [place]  [place] [address]  Fax: [phone]

## 2010-08-28 NOTE — Discharge Summary (Signed)
[name], [name]:  [id number]   MEDICAL RECORD NO.:  [id number]          PATIENT TYPE:  INP   LOCATION:  2928                         FACILITY:  [facility]   PHYSICIAN:  Doylene Canning. Ladona Ridgel, M.D.  DATE OF BIRTH:  [birth year]   DATE OF ADMISSION:  06/10/2005  DATE OF DISCHARGE:  06/13/2005                                 DISCHARGE SUMMARY   PRIMARY CARDIOLOGIST:  Learta Codding, M.D.   PRINCIPAL DIAGNOSIS:  Non-ST elevation myocardial infarction.   OTHER DIAGNOSES:  1.  Coronary artery disease, status post myocardial infarction in October      2006, with stenting of the left circumflex with bare metal stent.  2.  Hypertension.  3.  Hyperlipidemia.  4.  Gastroesophageal reflux disease.   ALLERGIES:  PENICILLIN and INDOCIN.   PROCEDURES:  Cardiac catheterization with percutaneous coronary intervention  and stenting of in-stent restenosis in the mid-left circumflex with Taxus  drug-eluting stent.   HISTORY OF PRESENT ILLNESS:  A 75 year old female with prior history of CAD,  status post MI in October 2006, with successful stenting of the left  circumflex with bare metal stent.  She was in her usual state of health  until approximately one week ago, when she began to experience tightness in  her throat associated with shortness of breath.  Chest pain has persisted  throughout the weekend and was relieved with sublingual nitroglycerin.  On  Monday, February 26, she called [facility] in [place] and arranged an  appointment for March 1.  Unfortunately, she had recurrent chest pain  prompting her to present to [facility] on the evening of February 28,  and she was transferred to [facility] for further evaluation.  Notably, her  troponin I was elevated at 0.32 and subsequently 0.36 with a CK-MB of 4.3,  ruling her in for non-ST elevation MI.   HOSPITAL COURSE:  [name] underwent left heart cardiac catheterization  here at [facility] on March 2,  revealing a 99% in-stent restenosis in the  left circumflex.  She otherwise had nonobstructive coronary disease with an  EF of 55% and mild inferior hypokinesis.  Films were reviewed with Dr.  Riley Kill, and she underwent successful PCI and stenting of the left  circumflex with a 2.75 x 24 mm Taxus drug-eluting stent.  She tolerated this  procedure well and was monitored in step-down post procedure without any  complications.  She has been ambulating without recurrent symptoms or  limitations, being discharged home today in satisfactory condition.   DISCHARGE LABORATORY DATA:  Hemoglobin 11.3, hematocrit 33.5, WBC 5.9,  platelets 169, MCV 78.1.  Sodium 140, potassium 4.5, chloride 109, CO2 27,  BUN 12, creatinine 0.8, glucose 104, total bilirubin 0.7, alkaline  phosphatase 91, AST 23, ALT 17, albumin 3.6.  CK 58, CK-MB 5.0, peak  troponin 0.29.  Total cholesterol 134, triglycerides 91, HDL 45, LDL 71.  Calcium 8.7.  TSH 2.126.   DISPOSITION:  Patient being discharged home today in good condition.   FOLLOW-UP PLANS AND APPOINTMENTS:  She is asked to follow up with Dr. Andee Lineman  in approximately two weeks.   DISCHARGE MEDICATIONS:  1.  Aspirin 81 mg daily.  2.  Plavix 75 mg daily.  3.  Toprol XL 50 mg daily.  4.  Zocor 80 mg q.h.s.  5.  Lisinopril/hydrochlorothiazide 20/25 mg daily.   OUTSTANDING LAB STUDIES:  None.   DURATION OF DISCHARGE ENCOUNTER:  35 minutes including physician time.      Ok Anis, NP    ______________________________  Doylene Canning. Ladona Ridgel, M.D.    CRB/MEDQ  D:  06/13/2005  T:  06/14/2005  Job:  [id number]   cc:   Learta Codding, M.D. [facility]  [address]  [address]  [place]  [place] [address]

## 2010-08-28 NOTE — Discharge Summary (Signed)
[name], [name]              ACCOUNT NO.:  [id number]   MEDICAL RECORD NO.:  [id number]          PATIENT TYPE:  INP   LOCATION:  2921                         FACILITY:  [facility]   PHYSICIAN:  Charlies Constable, M.D. [facility] DATE OF BIRTH:  04/19/1935   DATE OF ADMISSION:  01/20/2005  DATE OF DISCHARGE:  01/21/2005                                 DISCHARGE SUMMARY   PRIMARY CARDIOLOGIST:  Learta Codding, M.D.   PRIMARY DIAGNOSES:  1.  Non-ST segment elevation myocardial infarction.  2.  Dyspnea/smothering, probable anginal equivalent.  3.  Chronic left-sided neck pain per Dr. Margo Common.  4.  Hypertension.  5.  Family history of coronary artery disease in her father.  6.  Obesity.   ALLERGIES:  PENICILLIN.   PROCEDURES:  1.  Cardiac catheterization.  2.  Coronary arteriogram.  3.  Left ventriculogram.  4.  PTCA and bare metal stent to the circumflex.  5.  Preserved left ventricular function with an EF of 55% at cath.  6.  Moderate residual coronary artery disease in the RCA, circumflex, OM and      LAD, medical therapy recommended.  Lesions between 30 and 50%.   HOSPITAL COURSE:  [name] is a 75 year old female with no known history  of coronary artery disease.  She had a choking sensation and dyspnea that  was worse when she was supine.  She also had some chest tightness.  She came  to the emergency room and her EKG had some ST depression.  Initial troponins  were elevated at 0.46.  She was evaluated by Dr. __________ and a cardiac  consult was called.  She was evaluated by Dr. Andee Lineman and transferred to  [facility] for cardiac catheterization.   The troponin I was elevated to 3.73 indicating a non-ST segment elevation  MI.  The cardiac catheterization demonstrated 95% circumflex which was  treated with PTCA and a bare metal stent, reducing the stenosis to less than  10%.  She tolerated the procedure well.   Post procedure, [name] had some burning and  water brash which she said  were different from the symptoms that brought her here and were improved by  Mylanta.  Because of the full strength aspirin and Plavix that she needs to  be on currently, she was given Protonix 40 mg b.i.d.  Additionally, she was  started on Zocor at 20 mg a day.  Her lipid profile showed a total  cholesterol of 154 with an HDL of 49 and an LDL of 85.   On January 22, 2005, [name] was ambulating without chest pain or  shortness of breath.  She was evaluated by Dr. Juanda Chance and considered stable  for discharge with outpatient follow-up arranged.   LABORATORY DATA:  Hemoglobin 11.7, hematocrit 33.9, wbc 8.2, platelets 199.  Sodium 138, potassium 3.8, chloride 106, CO2 26, BUN 9, creatinine 0.8,  glucose 100.  Peak troponin I 4.27.  Total cholesterol 154, triglycerides  99, HDL 49, LDL 85.  TSH 2.085.   Chest x-ray:  No acute disease.   DISCHARGE INSTRUCTIONS:  1.  Her activity level is to include no driving for two days and no lifting      for two weeks.  2.  She is to call the office for problems with the cath site.  3.  She is to stick to a low fat diet.  4.  She is to follow up with Dr. Andee Lineman on February 03, 2005, at 1:20 p.m.      and with Dr. Margo Common as needed.   DISCHARGE MEDICATIONS:  1.  Plavix 75 mg daily.  2.  Nitroglycerin sublingual p.r.n.  3.  Enteric coated aspirin 325 mg daily.  4.  Lisinopril/hydrochlorothiazide 20/12.5 mg daily.  5.  Zocor 20 mg daily.  6.  Toprol XL 50 mg daily.  7.  Xanax as prior to admission.  8.  Protonix 40 mg b.i.d.      Theodore Demark, P.A. [facility]    ______________________________  Charlies Constable, M.D. [facility]    RB/MEDQ  D:  01/22/2005  T:  01/22/2005  Job:  [id number]   cc:   [facility] [place] [place]   Wyvonnia Lora  Fax: [phone]

## 2011-03-15 ENCOUNTER — Other Ambulatory Visit: Payer: Self-pay | Admitting: *Deleted

## 2011-03-15 MED ORDER — CLOPIDOGREL BISULFATE 75 MG PO TABS: 75.0000 mg | ORAL_TABLET | Freq: Every day | ORAL | Status: DC

## 2011-04-13 HISTORY — PX: SKIN CANCER EXCISION: SHX779

## 2011-04-26 ENCOUNTER — Encounter (INDEPENDENT_AMBULATORY_CARE_PROVIDER_SITE_OTHER): Payer: Medicare Other | Admitting: Ophthalmology

## 2011-04-26 DIAGNOSIS — H26499 Other secondary cataract, unspecified eye: Secondary | ICD-10-CM

## 2011-04-26 DIAGNOSIS — H43819 Vitreous degeneration, unspecified eye: Secondary | ICD-10-CM

## 2011-04-26 DIAGNOSIS — H3530 Unspecified macular degeneration: Secondary | ICD-10-CM

## 2011-05-10 ENCOUNTER — Ambulatory Visit (INDEPENDENT_AMBULATORY_CARE_PROVIDER_SITE_OTHER): Payer: Medicare Other | Admitting: Ophthalmology

## 2011-05-10 DIAGNOSIS — H2700 Aphakia, unspecified eye: Secondary | ICD-10-CM

## 2011-06-08 ENCOUNTER — Encounter: Payer: Self-pay | Admitting: *Deleted

## 2011-06-10 ENCOUNTER — Ambulatory Visit (INDEPENDENT_AMBULATORY_CARE_PROVIDER_SITE_OTHER): Payer: Medicare Other | Admitting: Cardiology

## 2011-06-10 ENCOUNTER — Encounter: Payer: Self-pay | Admitting: Cardiology

## 2011-06-10 VITALS — BP 142/78 | HR 69 | Ht 62.5 in | Wt 202.0 lb

## 2011-06-10 DIAGNOSIS — K219 Gastro-esophageal reflux disease without esophagitis: Secondary | ICD-10-CM | POA: Insufficient documentation

## 2011-06-10 DIAGNOSIS — R9430 Abnormal result of cardiovascular function study, unspecified: Secondary | ICD-10-CM | POA: Insufficient documentation

## 2011-06-10 DIAGNOSIS — I10 Essential (primary) hypertension: Secondary | ICD-10-CM | POA: Insufficient documentation

## 2011-06-10 DIAGNOSIS — I2510 Atherosclerotic heart disease of native coronary artery without angina pectoris: Secondary | ICD-10-CM | POA: Insufficient documentation

## 2011-06-10 MED ORDER — RANITIDINE HCL 75 MG PO TABS: 75.0000 mg | ORAL_TABLET | Freq: Two times a day (BID) | ORAL | Status: DC

## 2011-06-10 NOTE — Patient Instructions (Signed)
Your physician wants you to follow-up in: 6 months. You will receive a reminder letter in the mail one-two months in advance. If you don't receive a letter, please call our office to schedule the follow-up appointment. Increase Zantac (ranitidine) to 75 mg two times a day.

## 2011-06-10 NOTE — Progress Notes (Signed)
Christina Bottoms, MD, [facility]IM Board Certified in Adult Cardiovascular Medicine,Internal Medicine and Critical Care Medicine    CC: followup patient coronary artery disease and prior coronary intervention  HPI:  The patient is a 76 year old female with history of coronary artery disease status post coronary stenting. She had a stress test in 2010 which was low-risk. She reports no angina. She has no chest pain either at rest on exertion. She reports no dyspnea. 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 The patient doesn't quite a bit of indigestion which is improved with taking Zantac in the evening. Nocturnal GERD is resolved but she still has some GERD her in the daytime. She has no orthopnea PND palpitations or syncope. She is otherwise stable from a cardiovascular perspective  PMH: reviewed and listed in Problem List in Electronic Records (and see below) Past Medical History  Diagnosis Date  . Dyslipidemia     On statin drug therapy  . Hypertension   . Chronic neck pain   . Acute GI bleeding     No recurrence and stable  . Postmenopausal bleeding     history  . Coronary artery disease     Status post non-ST elevation microinfarction with bare-metal stent to circumflex. 2006. Non-ST elevation myocardial infarction with in-stent restenosis 2007 requiring drug-eluting stent., [facility] Plavix  . Abnormal cardiovascular function study      Cardiolite 2010 questionable lateral defect low risk ejection fraction 61%   . GERD (gastroesophageal reflux disease)      on H2 blocker   Past Surgical History  Procedure Date  . Cardiac catheterization 10/06. 02/07    left heart, with bare metal stent  . Cholecystectomy 1970  . Dilation and curettage of uterus   . Cervical polypectomy 06/13/03    endometrial    Allergies/SH/FHX : available in Electronic Records for review    Allergies  Allergen Reactions  . Penicillins Swelling and Rash    REACTION: unspecified   History   Social History  . Marital Status: Divorced    Spouse Name: N/A    Number of Children: N/A  . Years of Education: N/A   Occupational History  . Not on file.   Social History Main Topics  . Smoking status: Never Smoker   . Smokeless tobacco: Never Used  . Alcohol Use: No  . Drug Use: Not on file  . Sexually Active: Not on file   Other Topics Concern  . Not on file   Social History Narrative  . No narrative on file   No family history on file.  Medications: Current Outpatient Prescriptions  Medication Sig Dispense Refill  . aspirin 81 MG tablet Take 81 mg by mouth daily.      . calcium gluconate 500 MG tablet Take 500 mg by mouth 2 (two) times daily.      . clopidogrel (PLAVIX) 75 MG tablet Take 1 tablet (75 mg total) by mouth daily.  30 tablet  6  . fish oil-omega-3 fatty acids 1000 MG capsule Take 2 g by mouth 2 (two) times daily.       Marland Kitchen ibuprofen (ADVIL,MOTRIN) 200 MG tablet Take 200 mg by mouth every 6 (six) hours as needed.      Marland Kitchen lisinopril-hydrochlorothiazide (PRINZIDE,ZESTORETIC) 20-25 MG per tablet Take 1 tablet by mouth daily.      . metoprolol tartrate (LOPRESSOR)  25 MG tablet Take 25 mg by mouth 2 (two) times daily.      . nitroGLYCERIN (NITROSTAT) 0.4 MG SL tablet Place under the tongue every 5 (five) minutes as needed.      . pravastatin (PRAVACHOL) 80 MG tablet Take 80 mg by mouth daily.      . ranitidine (ZANTAC) 75 MG tablet Take 1 tablet (75 mg total) by mouth 2 (two) times daily.      . vitamin C (ASCORBIC ACID) 500 MG tablet Take 500 mg by mouth daily.        ROS: No nausea or vomiting. No fever or chills.No melena or hematochezia.No bleeding.No claudication  Physical Exam: BP 142/78  Pulse 69  Ht 5' 2.5" (1.588 m)  Wt 202 lb (91.627 kg)  BMI 36.36 kg/m2 General: Well-nourished white female in no apparent distress Neck: Normal carotid upstroke  no carotid bruit. No thyromegaly nonnodular thyroid Lungs: Clear breath sounds bilaterally no wheezing Cardiac: Regular rate and rhythm with normal S1-S2 no murmur rubs or gallops Vascular: 1+ edema secondary to varicose veins  Skin: Warm and dry Physcologic: Normal affect  12lead ECG: Normal sinus rhythm no acute changes Limited bedside ECHO:N/A No images are attached to the encounter.    Patient Active Problem List  Diagnoses  . DYSLIPIDEMIA  . OBESITY  . NECK PAIN, CHRONIC  . CHEST PAIN  . PRECORDIAL PAIN  . GI BLEEDING-no recurrence   . GERD (gastroesophageal reflux disease)-increased daytime symptoms   . Abnormal cardiovascular function study  . Coronary artery disease-status post prior PCI/stent on Plavix therapy-history of in-stent restenosis circumflex coronary artery   . Hypertension Dependent edema secondary to venous insufficiency     PLAN   Patient will continue on combination of aspirin and Plavix. She reports no bleeding complications.  Her cholesterol is followed by her primary care physician.  She does not report any angina but clearly has worsening daytime GERD symptoms. I told her that she can take an additional 75 mg of Zantac in the a.m.  No indication for cardiovascular stress testing at the present time will followup in 6 months.

## 2011-09-21 ENCOUNTER — Other Ambulatory Visit: Payer: Self-pay | Admitting: Family Medicine

## 2011-09-21 DIAGNOSIS — R921 Mammographic calcification found on diagnostic imaging of breast: Secondary | ICD-10-CM

## 2011-09-28 ENCOUNTER — Other Ambulatory Visit: Payer: Self-pay | Admitting: Family Medicine

## 2011-09-28 ENCOUNTER — Ambulatory Visit
Admission: RE | Admit: 2011-09-28 | Discharge: 2011-09-28 | Disposition: A | Discharge status: Home / Self Care | Payer: Medicare Other | Source: Ambulatory Visit | Attending: Family Medicine | Admitting: Family Medicine

## 2011-09-28 DIAGNOSIS — R921 Mammographic calcification found on diagnostic imaging of breast: Secondary | ICD-10-CM

## 2011-10-11 ENCOUNTER — Encounter: Payer: Self-pay | Admitting: Cardiology

## 2011-10-11 ENCOUNTER — Ambulatory Visit (INDEPENDENT_AMBULATORY_CARE_PROVIDER_SITE_OTHER): Payer: Medicare Other | Admitting: Cardiology

## 2011-10-11 VITALS — BP 141/78 | HR 66 | Ht 62.0 in | Wt 202.8 lb

## 2011-10-11 DIAGNOSIS — I10 Essential (primary) hypertension: Secondary | ICD-10-CM

## 2011-10-11 DIAGNOSIS — E785 Hyperlipidemia, unspecified: Secondary | ICD-10-CM

## 2011-10-11 DIAGNOSIS — Z01810 Encounter for preprocedural cardiovascular examination: Secondary | ICD-10-CM

## 2011-10-11 NOTE — Assessment & Plan Note (Signed)
Her blood pressure is borderline and it has been over 140 slightly on 3 successive readings. She needs weight loss and therapeutic lifestyle changes. I discussed keeping a blood pressure diary. Otherwise no change in therapy is indicated.

## 2011-10-11 NOTE — Assessment & Plan Note (Signed)
I look at her most recent lipids with an LDL of 69 and HDL of 79. I suggested he could come off of the omega-3 but she will discuss this further with Dr. Andee Lineman.

## 2011-10-11 NOTE — Progress Notes (Signed)
HPI The patient presents for preoperative clearance prior to having a breast lumpectomy. She has a history of coronary disease as described below. Her last stress perfusion study was 2010.  She was managed medically. Since that time she's had no new cardiovascular complaints. She denies any ongoing chest pressure, neck or arm discomfort. She has no shortness of breath, PND or orthopnea. She thinks prior symptoms were improved when she started taking Zantac. She walks on a treadmill frequently and can climb a flight of stairs without chest discomfort.  Allergies  Allergen Reactions  . Penicillins Swelling and Rash    REACTION: unspecified    Current Outpatient Prescriptions  Medication Sig Dispense Refill  . aspirin 81 MG tablet Take 81 mg by mouth daily.      . calcium gluconate 500 MG tablet Take 500 mg by mouth 2 (two) times daily.      . clopidogrel (PLAVIX) 75 MG tablet Take 1 tablet (75 mg total) by mouth daily.  30 tablet  6  . fish oil-omega-3 fatty acids 1000 MG capsule Take 2 g by mouth 2 (two) times daily.       Marland Kitchen ibuprofen (ADVIL,MOTRIN) 200 MG tablet Take 200 mg by mouth every 6 (six) hours as needed.      Marland Kitchen lisinopril-hydrochlorothiazide (PRINZIDE,ZESTORETIC) 20-25 MG per tablet Take 1 tablet by mouth daily.      . metoprolol tartrate (LOPRESSOR) 25 MG tablet Take 25 mg by mouth 2 (two) times daily.      . nitroGLYCERIN (NITROSTAT) 0.4 MG SL tablet Place under the tongue every 5 (five) minutes as needed.      . pravastatin (PRAVACHOL) 80 MG tablet Take 80 mg by mouth daily.      . ranitidine (ZANTAC) 75 MG tablet Take 1 tablet (75 mg total) by mouth 2 (two) times daily.      . vitamin C (ASCORBIC ACID) 500 MG tablet Take 500 mg by mouth daily.        Past Medical History  Diagnosis Date  . Dyslipidemia     On statin drug therapy  . Hypertension   . Chronic neck pain   . Acute GI bleeding     No recurrence and stable  . Postmenopausal bleeding     history  . Coronary  artery disease     Status post non-ST elevation microinfarction with bare-metal stent to circumflex. 2006. Non-ST elevation myocardial infarction with in-stent restenosis 2007 requiring drug-eluting stent., [facility]elong Plavix  . Abnormal cardiovascular function study      Cardiolite 2010 questionable lateral defect low risk ejection fraction 61%   . GERD (gastroesophageal reflux disease)      on H2 blocker    Past Surgical History  Procedure Date  . Cardiac catheterization 10/06. 02/07    left heart, with bare metal stent  . Cholecystectomy 1970  . Dilation and curettage of uterus   . Cervical polypectomy 06/13/03    endometrial    ROS:  As stated in the HPI and negative for all other systems.  PHYSICAL EXAM BP 141/78  Pulse 66  Ht 5\' 2"  (1.575 m)  Wt 202 lb 12.8 oz (91.989 kg)  BMI 37.09 kg/m2 GENERAL:  Well appearing HEENT:  Pupils equal round and reactive, fundi not visualized, oral mucosa unremarkable NECK:  No jugular venous distention, waveform within normal limits, carotid upstroke brisk and symmetric, no bruits, no thyromegaly LYMPHATICS:  No cervical, inguinal adenopathy LUNGS:  Clear to auscultation bilaterally BACK:  No  CVA tenderness CHEST:  Unremarkable HEART:  PMI not displaced or sustained,S1 and S2 within normal limits, no S3, no S4, no clicks, no rubs, no murmurs ABD:  Flat, positive bowel sounds normal in frequency in pitch, no bruits, no rebound, no guarding, no midline pulsatile mass, no hepatomegaly, no splenomegaly EXT:  2 plus pulses throughout, no edema, no cyanosis no clubbing   ASSESSMENT AND PLAN

## 2011-10-11 NOTE — Patient Instructions (Addendum)
Your physician recommends that you schedule a follow-up appointment in: 6 months with Dr. Andee Lineman. You will receive a reminder letter in the mail in about 4 months reminding you to call and schedule your appointment. If you don't receive this letter, please contact our office.   Your physician recommends that you continue on your current medications as directed. Please refer to the Current Medication list given to you today.

## 2011-10-11 NOTE — Assessment & Plan Note (Addendum)
The patient has had no new symptoms since her last stress test. She has a moderately high functional level (greater than 5 METS).  This is a low-risk procedure from a cardiovascular standpoint. Therefore, based on ACC/AHA guidelines, the patient would be at acceptable risk for the planned procedure without further cardiovascular testing.  She can stop the Plavix 5 days before the procedure.

## 2011-10-15 HISTORY — PX: BREAST SURGERY: SHX581

## 2011-11-09 ENCOUNTER — Encounter (INDEPENDENT_AMBULATORY_CARE_PROVIDER_SITE_OTHER): Payer: Medicare Other | Admitting: Internal Medicine

## 2011-11-09 DIAGNOSIS — I2510 Atherosclerotic heart disease of native coronary artery without angina pectoris: Secondary | ICD-10-CM

## 2011-11-09 DIAGNOSIS — D0590 Unspecified type of carcinoma in situ of unspecified breast: Secondary | ICD-10-CM

## 2011-11-29 ENCOUNTER — Other Ambulatory Visit: Payer: Self-pay | Admitting: Cardiology

## 2011-12-07 ENCOUNTER — Ambulatory Visit: Payer: Medicare Other | Admitting: Cardiology

## 2012-04-24 ENCOUNTER — Encounter (INDEPENDENT_AMBULATORY_CARE_PROVIDER_SITE_OTHER): Payer: Medicare Other

## 2012-04-24 DIAGNOSIS — D0590 Unspecified type of carcinoma in situ of unspecified breast: Secondary | ICD-10-CM

## 2012-06-21 ENCOUNTER — Encounter: Payer: Self-pay | Admitting: Physician Assistant

## 2012-06-21 ENCOUNTER — Encounter: Payer: Self-pay | Admitting: *Deleted

## 2012-06-21 ENCOUNTER — Ambulatory Visit (INDEPENDENT_AMBULATORY_CARE_PROVIDER_SITE_OTHER): Payer: Medicare Other | Admitting: Physician Assistant

## 2012-06-21 VITALS — BP 146/76 | HR 76 | Ht 61.5 in | Wt 207.0 lb

## 2012-06-21 DIAGNOSIS — I2510 Atherosclerotic heart disease of native coronary artery without angina pectoris: Secondary | ICD-10-CM

## 2012-06-21 DIAGNOSIS — R072 Precordial pain: Secondary | ICD-10-CM

## 2012-06-21 DIAGNOSIS — I10 Essential (primary) hypertension: Secondary | ICD-10-CM

## 2012-06-21 DIAGNOSIS — E785 Hyperlipidemia, unspecified: Secondary | ICD-10-CM

## 2012-06-21 NOTE — Progress Notes (Signed)
Primary Cardiologist: Simona Huh, MD (new)   HPI: Patient returns to clinic after a prolonged hiatus, last seen here in July 2013, by Dr. Antoine Poche.  She reports recent development of several episodes of dull chest pain, unpredictable in onset, and with no strict correlation with exertion. Episodes are localized to the left breast region, with no associated radiation, dyspnea, or nausea. They resolve spontaneously within 5 minutes. She denies any similarity of these symptoms with her prior NSTEMIs, most recently in 2007. Her last ischemic evaluation was a low risk Cardiolite in 2010, indicating questionable lateral defect; EF 61%.   12-lead EKG today, reviewed by me, indicates NSR 76 bpm, occasional PAC  Allergies  Allergen Reactions  . Penicillins Swelling and Rash    REACTION: unspecified    Current Outpatient Prescriptions  Medication Sig Dispense Refill  . ALPRAZolam (XANAX) 0.5 MG tablet Take 0.5 mg by mouth as needed.       Marland Kitchen aspirin 81 MG tablet Take 81 mg by mouth daily.      . clopidogrel (PLAVIX) 75 MG tablet TAKE 1 TABLET BY MOUTH EVERY DAY  30 tablet  6  . ibuprofen (ADVIL,MOTRIN) 200 MG tablet Take 200 mg by mouth every 6 (six) hours as needed.      Marland Kitchen lisinopril-hydrochlorothiazide (PRINZIDE,ZESTORETIC) 20-25 MG per tablet Take 1 tablet by mouth daily.      . metoprolol tartrate (LOPRESSOR) 25 MG tablet Take 25 mg by mouth 2 (two) times daily.      . nitroGLYCERIN (NITROSTAT) 0.4 MG SL tablet Place under the tongue every 5 (five) minutes as needed.      . ranitidine (ZANTAC) 75 MG tablet Take 75 mg by mouth at bedtime as needed.       No current facility-administered medications for this visit.    Past Medical History  Diagnosis Date  . Dyslipidemia     On statin drug therapy  . Hypertension   . Chronic neck pain   . Acute GI bleeding     No recurrence and stable  . Postmenopausal bleeding     history  . Coronary artery disease     Status post non-ST elevation  microinfarction with bare-metal stent to circumflex. 2006. Non-ST elevation myocardial infarction with in-stent restenosis 2007 requiring drug-eluting stent., [facility] Plavix  . Abnormal cardiovascular function study      Cardiolite 2010 questionable lateral defect low risk ejection fraction 61%   . GERD (gastroesophageal reflux disease)      on H2 blocker    Past Surgical History  Procedure Laterality Date  . Cardiac catheterization  10/06. 02/07    left heart, with bare metal stent  . Cholecystectomy  1970  . Dilation and curettage of uterus    . Cervical polypectomy  06/13/03    endometrial    History   Social History  . Marital Status: Divorced    Spouse Name: N/A    Number of Children: N/A  . Years of Education: N/A   Occupational History  . Not on file.   Social History Main Topics  . Smoking status: Never Smoker   . Smokeless tobacco: Never Used  . Alcohol Use: No  . Drug Use: Not on file  . Sexually Active: Not on file   Other Topics Concern  . Not on file   Social History Narrative  . No narrative on file    No family history on file.  ROS: no nausea, vomiting; no fever, chills; no melena, hematochezia; no  claudication  PHYSICAL EXAM: BP 146/76  Pulse 76  Ht 5' 1.5" (1.562 m)  Wt 207 lb (93.895 kg)  BMI 38.48 kg/m2 GENERAL: 77 year old female moderately obese; NAD HEENT: NCAT, PERRLA, EOMI; sclera clear; no xanthelasma NECK: palpable bilateral carotid pulses, no bruits; no JVD; no TM LUNGS: CTA bilaterally CARDIAC: RRR (S1, S2); no significant murmurs; no rubs or gallops ABDOMEN: soft, protuberant EXTREMETIES: no significant peripheral edema SKIN: warm/dry; no obvious rash/lesions MUSCULOSKELETAL: no joint deformity NEURO: no focal deficit; NL affect   EKG: reviewed and available in Electronic Records   ASSESSMENT & PLAN:  Coronary artery disease Will evaluate further with a GXT stress Cardiolite study to rule out ischemia. Lopressor to be  held a.m. of test. I recommend maintaining a low threshold for cardiac catheterization, given her history of prior NSTEMIs, most recently in 2007 secondary to ISR. She was also advised to remain on Plavix indefinitely. We will arrange early followup with Dr. Diona Browner, with whom she has elected to establish, for review of test results and further recommendations.  DYSLIPIDEMIA Patient has since taken herself off pravastatin. She watched a television program where a physician suggested that statins could result in development of breast nodules. Given that she has undergone a breast lumpectomy for such, she has decided to hold off on this therapy. She would like more information on this subject, in addition to further recommendations by her oncologist.  Hypertension Stable on current medication regimen    Gene Valorie Mcgrory, PAC

## 2012-06-21 NOTE — Assessment & Plan Note (Addendum)
Will evaluate further with a GXT stress Cardiolite study to rule out ischemia. Lopressor to be held a.m. of test. I recommend maintaining a low threshold for cardiac catheterization, given her history of prior NSTEMIs, most recently in 2007 secondary to ISR. She was also advised to remain on Plavix indefinitely. We will arrange early followup with Dr. Diona Browner, with whom she has elected to establish, for review of test results and further recommendations.

## 2012-06-21 NOTE — Patient Instructions (Signed)
   GXT (Exercise) Cardiolite stress test  Lab for FLP, LFT  Office will contact with results Continue all current medications. Follow up in  2 weeks

## 2012-06-21 NOTE — Assessment & Plan Note (Signed)
Patient has since taken herself off pravastatin. She watched a television program where a physician suggested that statins could result in development of breast nodules. Given that she has undergone a breast lumpectomy for such, she has decided to hold off on this therapy. She would like more information on this subject, in addition to further recommendations by her oncologist.

## 2012-06-21 NOTE — Assessment & Plan Note (Signed)
Stable on current medication regimen 

## 2012-06-29 ENCOUNTER — Telehealth: Payer: Self-pay | Admitting: Physician Assistant

## 2012-06-29 NOTE — Telephone Encounter (Signed)
Auth # [id number] exp 08/13/12

## 2012-06-29 NOTE — Telephone Encounter (Signed)
GXT (EXERCISE) Cardiolite stress test.   Diagnosis: 786.51, 414.01  Friday, June 30, 2012 @ [facility]

## 2012-06-30 DIAGNOSIS — I2510 Atherosclerotic heart disease of native coronary artery without angina pectoris: Secondary | ICD-10-CM

## 2012-07-06 ENCOUNTER — Encounter: Payer: Self-pay | Admitting: Physician Assistant

## 2012-07-06 ENCOUNTER — Ambulatory Visit (INDEPENDENT_AMBULATORY_CARE_PROVIDER_SITE_OTHER): Payer: Medicare Other | Admitting: Physician Assistant

## 2012-07-06 VITALS — BP 139/68 | HR 69 | Ht 61.0 in | Wt 203.0 lb

## 2012-07-06 DIAGNOSIS — I2510 Atherosclerotic heart disease of native coronary artery without angina pectoris: Secondary | ICD-10-CM

## 2012-07-06 DIAGNOSIS — E785 Hyperlipidemia, unspecified: Secondary | ICD-10-CM

## 2012-07-06 DIAGNOSIS — I10 Essential (primary) hypertension: Secondary | ICD-10-CM

## 2012-07-06 NOTE — Progress Notes (Signed)
Primary Cardiologist: Simona Huh, MD   HPI: Scheduled early followup for review of recent GXT Cardiolite, ordered for further evaluation of atypical chest pain.   - Exercise GXT Cardiolite, March 21: Overall NL study; EF 69% (89% MPHR)  Since her last OV, patient has not had any further chest pain. She also did not experience any CP during her recent stress test.  Allergies  Allergen Reactions  . Penicillins Swelling and Rash    REACTION: unspecified    Current Outpatient Prescriptions  Medication Sig Dispense Refill  . ALPRAZolam (XANAX) 0.5 MG tablet Take 0.5 mg by mouth as needed.       Marland Kitchen aspirin 81 MG tablet Take 81 mg by mouth daily.      . clopidogrel (PLAVIX) 75 MG tablet TAKE 1 TABLET BY MOUTH EVERY DAY  30 tablet  6  . ibuprofen (ADVIL,MOTRIN) 200 MG tablet Take 200 mg by mouth every 6 (six) hours as needed.      Marland Kitchen lisinopril-hydrochlorothiazide (PRINZIDE,ZESTORETIC) 20-25 MG per tablet Take 1 tablet by mouth daily.      . metoprolol tartrate (LOPRESSOR) 25 MG tablet Take 25 mg by mouth 2 (two) times daily.      . nitroGLYCERIN (NITROSTAT) 0.4 MG SL tablet Place under the tongue every 5 (five) minutes as needed.      . ranitidine (ZANTAC) 75 MG tablet Take 75 mg by mouth at bedtime as needed.       No current facility-administered medications for this visit.    Past Medical History  Diagnosis Date  . Dyslipidemia     On statin drug therapy  . Hypertension   . Chronic neck pain   . Acute GI bleeding     No recurrence and stable  . Postmenopausal bleeding     history  . Coronary artery disease     Status post non-ST elevation microinfarction with bare-metal stent to circumflex. 2006. Non-ST elevation myocardial infarction with in-stent restenosis 2007 requiring drug-eluting stent., [facility]elong Plavix  . Abnormal cardiovascular function study      Cardiolite 2010 questionable lateral defect low risk ejection fraction 61%   . GERD (gastroesophageal reflux disease)    on H2 blocker    Past Surgical History  Procedure Laterality Date  . Cardiac catheterization  10/06. 02/07    left heart, with bare metal stent  . Cholecystectomy  1970  . Dilation and curettage of uterus    . Cervical polypectomy  06/13/03    endometrial    History   Social History  . Marital Status: Divorced    Spouse Name: N/A    Number of Children: N/A  . Years of Education: N/A   Occupational History  . Not on file.   Social History Main Topics  . Smoking status: Never Smoker   . Smokeless tobacco: Never Used  . Alcohol Use: No  . Drug Use: Not on file  . Sexually Active: Not on file   Other Topics Concern  . Not on file   Social History Narrative  . No narrative on file    No family history on file.  ROS: no nausea, vomiting; no fever, chills; no melena, hematochezia; no claudication  PHYSICAL EXAM: BP 139/68  Pulse 69  Ht 5\' 1"  (1.549 m)  Wt 203 lb (92.08 kg)  BMI 38.38 kg/m2  SpO2 98% GENERAL: 77 year old female moderately obese; NAD  HEENT: NCAT, PERRLA, EOMI; sclera clear; no xanthelasma  NECK: palpable bilateral carotid pulses, no bruits; no JVD;  no TM  LUNGS: CTA bilaterally  CARDIAC: RRR (S1, S2); no significant murmurs; no rubs or gallops  ABDOMEN: soft, protuberant  EXTREMETIES: no significant peripheral edema  SKIN: warm/dry; no obvious rash/lesions  MUSCULOSKELETAL: no joint deformity  NEURO: no focal deficit; NL affect    EKG:    ASSESSMENT & PLAN:  Coronary artery disease No further workup indicated. Results of recent NL, adequate GXT Cardiolite were reviewed with the patient. She has not had any recurrent symptoms. Therefore, we'll have patient return in one year, at which time she will establish with Dr. Diona Browner. As previously documented, she is to remain on Plavix indefinitely, given history of prior NSTEMIs, most recently in 2007 secondary to ISR.  Hypertension Stable on current medication regimen  DYSLIPIDEMIA As  previously noted, patient had taken herself off pravastatin. She was concerned regarding the risk of developing breast nodules, having undergone prior breast lumpectomy. She is currently not on any lipid lowering regimen, including omega-3 fish oil which she had been taking in the past. She had been told by Dr. Antoine Poche in recent past that there was no proven benefit for this, from a cardiac perspective. Therefore, she stopped taking the fish oil. We'll request most recent FLP from primary M.D.'s office, before making any further recommendations.    Gene Jhovanny Guinta, PAC

## 2012-07-06 NOTE — Assessment & Plan Note (Signed)
Stable on current medication regimen 

## 2012-07-06 NOTE — Assessment & Plan Note (Signed)
No further workup indicated. Results of recent NL, adequate GXT Cardiolite were reviewed with the patient. She has not had any recurrent symptoms. Therefore, we'll have patient return in one year, at which time she will establish with Dr. Diona Browner. As previously documented, she is to remain on Plavix indefinitely, given history of prior NSTEMIs, most recently in 2007 secondary to ISR.

## 2012-07-06 NOTE — Assessment & Plan Note (Signed)
As previously noted, patient had taken herself off pravastatin. She was concerned regarding the risk of developing breast nodules, having undergone prior breast lumpectomy. She is currently not on any lipid lowering regimen, including omega-3 fish oil which she had been taking in the past. She had been told by Dr. Antoine Poche in recent past that there was no proven benefit for this, from a cardiac perspective. Therefore, she stopped taking the fish oil. We'll request most recent FLP from primary M.D.'s office, before making any further recommendations.

## 2012-07-06 NOTE — Patient Instructions (Signed)
Continue all current medications. Your physician wants you to follow up in:  1 year.  You will receive a reminder letter in the mail one-two months in advance.  If you don't receive a letter, please call our office to schedule the follow up appointment   

## 2012-08-09 ENCOUNTER — Other Ambulatory Visit: Payer: Self-pay | Admitting: Physician Assistant

## 2012-11-15 ENCOUNTER — Other Ambulatory Visit: Payer: Self-pay

## 2013-02-15 ENCOUNTER — Other Ambulatory Visit: Payer: Self-pay

## 2013-03-21 ENCOUNTER — Encounter: Payer: Self-pay | Admitting: Cardiology

## 2013-03-21 ENCOUNTER — Ambulatory Visit (INDEPENDENT_AMBULATORY_CARE_PROVIDER_SITE_OTHER): Payer: Medicare Other | Admitting: Cardiology

## 2013-03-21 VITALS — BP 143/74 | HR 73 | Ht 61.0 in | Wt 205.0 lb

## 2013-03-21 DIAGNOSIS — I2510 Atherosclerotic heart disease of native coronary artery without angina pectoris: Secondary | ICD-10-CM

## 2013-03-21 DIAGNOSIS — I10 Essential (primary) hypertension: Secondary | ICD-10-CM

## 2013-03-21 DIAGNOSIS — E785 Hyperlipidemia, unspecified: Secondary | ICD-10-CM

## 2013-03-21 MED ORDER — CLOPIDOGREL BISULFATE 75 MG PO TABS: 75.0000 mg | ORAL_TABLET | Freq: Once | ORAL | Status: DC

## 2013-03-21 NOTE — Progress Notes (Signed)
Clinical Summary [name] is a 77 y.o.female last seen by PA Serpe, this is our first visit together. She was seen for the following medical problems.  1. CAD - prior NSTEMI in 2006, BMS to RCA. In stent restenosis in 2007 with another DES placed to RCA, she has been committed to life long plavix.  - Exercise MPI 06/2012: exercised 3 min 50 seconds, 7 METs, 89% THR, LVEF 69%, no ischemia.  - denies any recent chest pain. Notes some DOE - compliant with meds: ASA, plavix, lisinopril, metoprolol  2. Hyperlipidemia - she stopped pravastatin previously because of leg pains, has not wanted to restart a statin  3. HTN - does not check regularly at home - compliant with meds  Past Medical History  Diagnosis Date  . Dyslipidemia     On statin drug therapy  . Hypertension   . Chronic neck pain   . Acute GI bleeding     No recurrence and stable  . Postmenopausal bleeding     history  . Coronary artery disease     Status post non-ST elevation microinfarction with bare-metal stent to circumflex. 2006. Non-ST elevation myocardial infarction with in-stent restenosis 2007 requiring drug-eluting stent., Lifelong Plavix  . Abnormal cardiovascular function study      Cardiolite 2010 questionable lateral defect low risk ejection fraction 61%   . GERD (gastroesophageal reflux disease)      on H2 blocker     Allergies  Allergen Reactions  . Penicillins Swelling and Rash    REACTION: unspecified     Current Outpatient Prescriptions  Medication Sig Dispense Refill  . ALPRAZolam (XANAX) 0.5 MG tablet Take 0.5 mg by mouth as needed.       Marland Kitchen aspirin 81 MG tablet Take 81 mg by mouth daily.      . clopidogrel (PLAVIX) 75 MG tablet TAKE 1 TABLET BY MOUTH EVERY DAY  30 tablet  6  . ibuprofen (ADVIL,MOTRIN) 200 MG tablet Take 200 mg by mouth every 6 (six) hours as needed.      Marland Kitchen lisinopril-hydrochlorothiazide (PRINZIDE,ZESTORETIC) 20-25 MG per tablet Take 1 tablet by mouth daily.      .  metoprolol tartrate (LOPRESSOR) 25 MG tablet Take 25 mg by mouth 2 (two) times daily.      . nitroGLYCERIN (NITROSTAT) 0.4 MG SL tablet Place under the tongue every 5 (five) minutes as needed.      . ranitidine (ZANTAC) 75 MG tablet Take 75 mg by mouth at bedtime as needed.       No current facility-administered medications for this visit.     Past Surgical History  Procedure Laterality Date  . Cardiac catheterization  10/06. 02/07    left heart, with bare metal stent  . Cholecystectomy  1970  . Dilation and curettage of uterus    . Cervical polypectomy  06/13/03    endometrial     Allergies  Allergen Reactions  . Penicillins Swelling and Rash    REACTION: unspecified      No family history on file.   Social History Ms. Lemus reports that she has never smoked. She has never used smokeless tobacco. Ms. Penix reports that she does not drink alcohol.   Review of Systems CONSTITUTIONAL: No weight loss, fever, chills, weakness or fatigue.  HEENT: Eyes: No visual loss, blurred vision, double vision or yellow sclerae.No hearing loss, sneezing, congestion, runny nose or sore throat.  SKIN: No rash or itching.  CARDIOVASCULAR: per HPI RESPIRATORY:  No shortness of breath, cough or sputum.  GASTROINTESTINAL: No anorexia, nausea, vomiting or diarrhea. No abdominal pain or blood.  GENITOURINARY: No burning on urination, no polyuria NEUROLOGICAL: No headache, dizziness, syncope, paralysis, ataxia, numbness or tingling in the extremities. No change in bowel or bladder control.  MUSCULOSKELETAL: No muscle, back pain, joint pain or stiffness.  LYMPHATICS: No enlarged nodes. No history of splenectomy.  PSYCHIATRIC: No history of depression or anxiety.  ENDOCRINOLOGIC: No reports of sweating, cold or heat intolerance. No polyuria or polydipsia.  Marland Kitchen   Physical Examination p 73 bp 143/74 Wt 205 lbs BMI 39 Gen: resting comfortably, no acute distress HEENT: no scleral icterus, pupils  equal round and reactive, no palptable cervical adenopathy,  CV: RRR, no m/r/g, no JVD, no carotid bruits Resp: Clear to auscultation bilaterally GI: abdomen is soft, non-tender, non-distended, normal bowel sounds, no hepatosplenomegaly MSK: extremities are warm, no edema.  Skin: warm, no rash Neuro:  no focal deficits Psych: appropriate affect   Diagnostic Studies 06/2012 Exercise MPI Exercise MPI 06/2012: exercised 3 min 50 seconds, 7 METs, 89% THR, LVEF 69%, no ischemia.     Assessment and Plan  1. CAD - no current symptoms, recent negative stress MPI - continue secondary prevention and risk factor modification - lifetime plavix per prior notes from prior cardiologist, will continue  2. HTN - at goal, continue current meds  3. Hyperlipidemia - prior statins have caused myalgias, she does not to try any other statins - continue fish oil      Antoine Poche, M.D., F.A.C.C.

## 2013-03-21 NOTE — Patient Instructions (Signed)
Continue all current medications. Your physician wants you to follow up in:  1 year.  You will receive a reminder letter in the mail one-two months in advance.  If you don't receive a letter, please call our office to schedule the follow up appointment   

## 2013-05-04 ENCOUNTER — Encounter: Payer: Self-pay | Admitting: Obstetrics and Gynecology

## 2013-05-04 ENCOUNTER — Ambulatory Visit (INDEPENDENT_AMBULATORY_CARE_PROVIDER_SITE_OTHER): Payer: Medicare HMO | Admitting: Obstetrics and Gynecology

## 2013-05-04 VITALS — BP 126/78 | Ht 62.0 in | Wt 205.0 lb

## 2013-05-04 DIAGNOSIS — N3941 Urge incontinence: Secondary | ICD-10-CM | POA: Insufficient documentation

## 2013-05-04 DIAGNOSIS — N952 Postmenopausal atrophic vaginitis: Secondary | ICD-10-CM | POA: Insufficient documentation

## 2013-05-04 MED ORDER — SOLIFENACIN SUCCINATE 10 MG PO TABS: 10.0000 mg | ORAL_TABLET | Freq: Every day | ORAL | Status: DC

## 2013-05-04 MED ORDER — ESTRADIOL 0.1 MG/GM VA CREA: 1.0000 g | TOPICAL_CREAM | VAGINAL | Status: DC

## 2013-05-04 NOTE — Patient Instructions (Signed)
Urinary Incontinence Urinary incontinence is the involuntary loss of urine from your bladder. CAUSES  There are many causes of urinary incontinence. They include:  Medicines.  Infections.  Prostatic enlargement, leading to overflow of urine from your bladder.  Surgery.  Neurological diseases.  Emotional factors. SIGNS AND SYMPTOMS Urinary Incontinence can be divided into four types: 1. Urge incontinence. Urge incontinence is the involuntary loss of urine before you have the opportunity to go to the bathroom. There is a sudden urge to void but not enough time to reach a bathroom. 2. Stress incontinence. Stress incontinence is the sudden loss of urine with any activity that forces urine to pass. It is commonly caused by anatomical changes to the pelvis and sphincter areas of your body. 3. Overflow incontinence. Overflow incontinence is the loss of urine from an obstructed opening to your bladder. This results in a backup of urine and a resultant buildup of pressure within the bladder. When the pressure within the bladder exceeds the closing pressure of the sphincter, the urine overflows, which causes incontinence, similar to water overflowing a dam. 4. Total incontinence. Total incontinence is the loss of urine as a result of the inability to store urine within your bladder. DIAGNOSIS  Evaluating the cause of incontinence may require:  A thorough and complete medical and obstetric history.  A complete physical exam.  Laboratory tests such as a urine culture and sensitivities. When additional tests are indicated, they can include:  An ultrasound exam.  Kidney and bladder X-rays.  Cystoscopy. This is an exam of the bladder using a narrow scope.  Urodynamic testing to test the nerve function to the bladder and sphincter areas. TREATMENT  Treatment for urinary incontinence depends on the cause:  For urge incontinence caused by a bacterial infection, antibiotics will be prescribed.  If the urge incontinence is related to medicines you take, your health care provider may have you change the medicine.  For stress incontinence, surgery to re-establish anatomical support to the bladder or sphincter, or both, will often correct the condition.  For overflow incontinence caused by an enlarged prostate, an operation to open the channel through the enlarged prostate will allow the flow of urine out of the bladder. In women with fibroids, a hysterectomy may be recommended.  For total incontinence, surgery on your urinary sphincter may help. An artificial urinary sphincter (an inflatable cuff placed around the urethra) may be required. In women who have developed a hole-like passage between their bladder and vagina (vesicovaginal fistula), surgery to close the fistula often is required. HOME CARE INSTRUCTIONS  Normal daily hygiene and the use of pads or adult diapers that are changed regularly will help prevent odors and skin damage.  Avoid caffeine. It can overstimulate your bladder.  Use the bathroom regularly. Try about every 2 3 hours to go to the bathroom, even if you do not feel the need to do so. Take time to empty your bladder completely. After urinating, wait a minute. Then try to urinate again.  For causes involving nerve dysfunction, keep a log of the medicines you take and a journal of the times you go to the bathroom. SEEK MEDICAL CARE IF:  You experience worsening of pain instead of improvement in pain after your procedure.  Your incontinence becomes worse instead of better. SEE IMMEDIATE MEDICAL CARE IF:  You experience fever or shaking chills.  You are unable to pass your urine.  You have redness spreading into your groin or down into your thighs. MAKE   SURE YOU:   Understand these instructions.   Will watch your condition.  Will get help right away if you are not doing well or get worse. Document Released: 05/06/2004 Document Revised: 01/17/2013 Document  Reviewed: 09/05/2012 [employer] Patient Information 2014 [employer].  

## 2013-05-04 NOTE — Progress Notes (Signed)
Patient ID: [name], female   DOB: [birth year], 78 y.o.   MRN: [id number]   [facility] Visit  Patient name: MARYKATHERINE SHERWOOD MRN [id number]  Date of birth: 26-Mar-1936  CC & HPI:  Shireen MIREYAH CHERVENAK is a 78 y.o. female presenting today for complaints of urge incont 3/month in am upon awakening  ROS:  Pelvic heaviness and bulge recurring, pt is s/p ant and post repair Not sexually active  Pertinent History Reviewed:  Medical & Surgical Hx:  Reviewed: Significant for CAD, BR Ca Medications: Reviewed & Updated - see associated section Social History: Reviewed -  reports that she has never smoked. She has never used smokeless tobacco.  Objective Findings:  Vitals: BP 126/78  Ht 5\' 2"  (1.575 m)  Wt 205 lb (92.987 kg)  BMI 37.49 kg/m2   Physical Examination: General appearance - alert, well appearing, and in no distress and overweight Abdomen - soft, nontender, nondistended, no masses or organomegaly Pelvic - VULVA: normal appearing vulva with no masses, tenderness or lesions, VAGINA: normal appearing vagina with normal color and discharge, no lesions, atrophic, PELVIC FLOOR EXAM: high small cystocele at apex of vagina, CERVIX: normal appearing cervix without discharge or lesions, surgically absent, ADNEXA: normal adnexa in size, nontender and no masses, exam limited by vag shortening  S/p ant repair, and atrophy , and scarring   Assessment & Plan:   Atrophic vaginitis,  UI Recurring small high cystocele  P: Rx estrace     Rx Vesicare

## 2013-06-18 ENCOUNTER — Ambulatory Visit (INDEPENDENT_AMBULATORY_CARE_PROVIDER_SITE_OTHER): Payer: Medicare HMO | Admitting: Obstetrics and Gynecology

## 2013-06-18 ENCOUNTER — Encounter: Payer: Self-pay | Admitting: Obstetrics and Gynecology

## 2013-06-18 VITALS — BP 146/78 | Ht 61.0 in | Wt 206.8 lb

## 2013-06-18 DIAGNOSIS — N8111 Cystocele, midline: Secondary | ICD-10-CM

## 2013-06-18 DIAGNOSIS — N952 Postmenopausal atrophic vaginitis: Secondary | ICD-10-CM

## 2013-06-18 DIAGNOSIS — IMO0002 Reserved for concepts with insufficient information to code with codable children: Secondary | ICD-10-CM

## 2013-06-18 MED ORDER — OXYBUTYNIN CHLORIDE 5 MG PO TABS: 5.0000 mg | ORAL_TABLET | Freq: Every day | ORAL | Status: DC

## 2013-06-18 NOTE — Patient Instructions (Addendum)
Conjugated Estrogens vaginal cream What is this medicine? CONJUGATED ESTROGENS (CON ju gate ed ESS troe jenz) are a mixture of female hormones. This cream can help relieve symptoms associated with menopause.like vaginal dryness and irritation. This medicine may be used for other purposes; ask your health care provider or pharmacist if you have questions. COMMON BRAND NAME(S): Premarin What should I tell my health care provider before I take this medicine? They need to know if you have any of these conditions: -abnormal vaginal bleeding -blood vessel disease or blood clots -breast, cervical, endometrial, or uterine cancer -dementia -diabetes -gallbladder disease -heart disease or recent heart attack -high blood pressure -high cholesterol -high level of calcium in the blood -hysterectomy -kidney disease -liver disease -migraine headaches -protein C deficiency -protein S deficiency -stroke -systemic lupus erythematosus (SLE) -tobacco smoker -an unusual or allergic reaction to estrogens other medicines, foods, dyes, or preservatives -pregnant or trying to get pregnant -breast-feeding How should I use this medicine? This medicine is for use in the vagina only. Do not take by mouth. Follow the directions on the prescription label. Use at bedtime unless otherwise directed by your doctor or health care professional. Use the special applicator supplied with the cream. Wash hands before and after use. Fill the applicator with the cream and remove from the tube. Lie on your back, part and bend your knees. Insert the applicator into the vagina and push the plunger to expel the cream into the vagina. Wash the applicator with warm soapy water and rinse well. Use exactly as directed for the complete length of time prescribed. Do not stop using except on the advice of your doctor or health care professional. Talk to your pediatrician regarding the use of this medicine in children. Special care may be  needed. A patient package insert for the product will be given with each prescription and refill. Read this sheet carefully each time. The sheet may change frequently. Overdosage: If you think you have taken too much of this medicine contact a poison control center or emergency room at once. NOTE: This medicine is only for you. Do not share this medicine with others. What if I miss a dose? If you miss a dose, use it as soon as you can. If it is almost time for your next dose, use only that dose. Do not use double or extra doses. What may interact with this medicine? Do not take this medicine with any of the following medications: -aromatase inhibitors like aminoglutethimide, anastrozole, exemestane, letrozole, testolactone This medicine may also interact with the following medications: -barbiturates used for inducing sleep or treating seizures -carbamazepine -grapefruit juice -medicines for fungal infections like itraconazole and ketoconazole -raloxifene or tamoxifen -rifabutin -rifampin -rifapentine -ritonavir -some antibiotics used to treat infections -St. Kemar Pandit's Wort -warfarin This list may not describe all possible interactions. Give your health care provider a list of all the medicines, herbs, non-prescription drugs, or dietary supplements you use. Also tell them if you smoke, drink alcohol, or use illegal drugs. Some items may interact with your medicine. What should I watch for while using this medicine? Visit your health care professional for regular checks on your progress. You will need a regular breast and pelvic exam. You should also discuss the need for regular mammograms with your health care professional, and follow his or her guidelines. This medicine can make your body retain fluid, making your fingers, hands, or ankles swell. Your blood pressure can go up. Contact your doctor or health care professional if you  feel you are retaining fluid. If you have any reason to think  you are pregnant; stop taking this medicine at once and contact your doctor or health care professional. Tobacco smoking increases the risk of getting a blood clot or having a stroke, especially if you are more than 76 years old. You are strongly advised not to smoke. If you wear contact lenses and notice visual changes, or if the lenses begin to feel uncomfortable, consult your eye care specialist. If you are going to have elective surgery, you may need to stop taking this medicine beforehand. Consult your health care professional for advice prior to scheduling the surgery. What side effects may I notice from receiving this medicine? Side effects that you should report to your doctor or health care professional as soon as possible: -allergic reactions like skin rash, itching or hives, swelling of the face, lips, or tongue -breast tissue changes or discharge -changes in vision -chest pain -confusion, trouble speaking or understanding -dark urine -general ill feeling or flu-like symptoms -light-colored stools -nausea, vomiting -pain, swelling, warmth in the leg -right upper belly pain -severe headaches -shortness of breath -sudden numbness or weakness of the face, arm or leg -trouble walking, dizziness, loss of balance or coordination -unusual vaginal bleeding -yellowing of the eyes or skin Side effects that usually do not require medical attention (report to your doctor or health care professional if they continue or are bothersome): -hair loss -increased hunger or thirst -increased urination -symptoms of vaginal infection like itching, irritation or unusual discharge -unusually weak or tired This list may not describe all possible side effects. Call your doctor for medical advice about side effects. You may report side effects to FDA at 1-800-FDA-1088. Where should I keep my medicine? Keep out of the reach of children. Store at room temperature between 15 and 30 degrees C (59 and 86  degrees F). Throw away any unused medicine after the expiration date. NOTE: This sheet is a summary. It may not cover all possible information. If you have questions about this medicine, talk to your doctor, pharmacist, or health care provider.  2014, [employer] Standard. (2010-07-01 09:20:36) Urinary Incontinence Urinary incontinence is the involuntary loss of urine from your bladder. CAUSES  There are many causes of urinary incontinence. They include:  Medicines.  Infections.  Prostatic enlargement, leading to overflow of urine from your bladder.  Surgery.  Neurological diseases.  Emotional factors. SIGNS AND SYMPTOMS Urinary Incontinence can be divided into four types: 1. Urge incontinence. Urge incontinence is the involuntary loss of urine before you have the opportunity to go to the bathroom. There is a sudden urge to void but not enough time to reach a bathroom. 2. Stress incontinence. Stress incontinence is the sudden loss of urine with any activity that forces urine to pass. It is commonly caused by anatomical changes to the pelvis and sphincter areas of your body. 3. Overflow incontinence. Overflow incontinence is the loss of urine from an obstructed opening to your bladder. This results in a backup of urine and a resultant buildup of pressure within the bladder. When the pressure within the bladder exceeds the closing pressure of the sphincter, the urine overflows, which causes incontinence, similar to water overflowing a dam. 4. Total incontinence. Total incontinence is the loss of urine as a result of the inability to store urine within your bladder. DIAGNOSIS  Evaluating the cause of incontinence may require:  A thorough and complete medical and obstetric history.  A complete physical exam.  Laboratory tests such as a urine culture and sensitivities. When additional tests are indicated, they can include:  An ultrasound exam.  Kidney and bladder  X-rays.  Cystoscopy. This is an exam of the bladder using a narrow scope.  Urodynamic testing to test the nerve function to the bladder and sphincter areas. TREATMENT  Treatment for urinary incontinence depends on the cause:  For urge incontinence caused by a bacterial infection, antibiotics will be prescribed. If the urge incontinence is related to medicines you take, your health care provider may have you change the medicine.  For stress incontinence, surgery to re-establish anatomical support to the bladder or sphincter, or both, will often correct the condition.  For overflow incontinence caused by an enlarged prostate, an operation to open the channel through the enlarged prostate will allow the flow of urine out of the bladder. In women with fibroids, a hysterectomy may be recommended.  For total incontinence, surgery on your urinary sphincter may help. An artificial urinary sphincter (an inflatable cuff placed around the urethra) may be required. In women who have developed a hole-like passage between their bladder and vagina (vesicovaginal fistula), surgery to close the fistula often is required. HOME CARE INSTRUCTIONS  Normal daily hygiene and the use of pads or adult diapers that are changed regularly will help prevent odors and skin damage.  Avoid caffeine. It can overstimulate your bladder.  Use the bathroom regularly. Try about every 2 3 hours to go to the bathroom, even if you do not feel the need to do so. Take time to empty your bladder completely. After urinating, wait a minute. Then try to urinate again.  For causes involving nerve dysfunction, keep a log of the medicines you take and a journal of the times you go to the bathroom. SEEK MEDICAL CARE IF:  You experience worsening of pain instead of improvement in pain after your procedure.  Your incontinence becomes worse instead of better. SEE IMMEDIATE MEDICAL CARE IF:  You experience fever or shaking chills.  You  are unable to pass your urine.  You have redness spreading into your groin or down into your thighs. MAKE SURE YOU:   Understand these instructions.   Will watch your condition.  Will get help right away if you are not doing well or get worse. Document Released: 05/06/2004 Document Revised: 01/17/2013 Document Reviewed: 09/05/2012 [facility] Patient Information 2014 [place].

## 2013-06-18 NOTE — Progress Notes (Signed)
This chart was scribed by Ludger Nutting, Medical Scribe, for Dr. Mallory Shirk on 06/18/13 at 9:43 AM. This chart was reviewed by Dr. Mallory Shirk and is accurate.   [facility] Visit  Patient name: [name] MRN [id number]  Date of birth: [birth year]  CC & HPI:  [name] is a 78 y.o. female presenting today for follow up urge incontinence. Pt states this has resolved but now she finds herself taking longer to void. She has been taking the prescribed medications with good results.   ROS:  Status post vaginal hysterectomy with anterior and posterior repair   Pertinent History Reviewed:  Medical & Surgical Hx:  Reviewed: Significant for   Past Medical History  Diagnosis Date  . Dyslipidemia     On statin drug therapy  . Hypertension   . Chronic neck pain   . Acute GI bleeding     No recurrence and stable  . Postmenopausal bleeding     history  . Coronary artery disease     Status post non-ST elevation microinfarction with bare-metal stent to circumflex. 2006. Non-ST elevation myocardial infarction with in-stent restenosis 2007 requiring drug-eluting stent., Lifelong Plavix  . Abnormal cardiovascular function study      Cardiolite 2010 questionable lateral defect low risk ejection fraction 61%   . GERD (gastroesophageal reflux disease)      on H2 blocker    Past Surgical History  Procedure Laterality Date  . Cardiac catheterization  10/06. 02/07    left heart, with bare metal stent  . Cholecystectomy  1970  . Dilation and curettage of uterus    . Cervical polypectomy  06/13/03    endometrial  . Breast surgery Right 10/15/11    lump removed  . Skin cancer excision  2013    Medications: Reviewed & Updated - see associated section Social History: Reviewed -  reports that she has never smoked. She has never used smokeless tobacco.    Objective Findings:  Vitals: BP 146/78  Ht 5\' 1"  (1.549 m)  Wt 206 lb 12.8 oz (93.804 kg)  BMI 39.09 kg/m2  Physical  Examination: General appearance - alert, well appearing, and in no distress and oriented to person, place, and time Pelvic - VULVA: normal appearing vulva with no masses, tenderness or lesions,  VAGINA: normal appearing vagina with normal color and discharge, no lesions, Excellent posterior support, mild weakness on the anterior wall. CERVIX: surgically absent,  UTERUS: surgically absent, vaginal cuff well healed,  ADNEXA: normal adnexa in size, nontender and no masses,      Assessment & Plan:   A: 1. Atrophic vaginitis 2. Grade 1 cystocele s/p TVH A&P repair   P: 1. Continue Premarin  2. Stop vesicare  3. Change to ditropan 5mg  hs

## 2013-09-06 ENCOUNTER — Encounter: Payer: Self-pay | Admitting: Cardiology

## 2014-03-26 ENCOUNTER — Ambulatory Visit (INDEPENDENT_AMBULATORY_CARE_PROVIDER_SITE_OTHER): Payer: Private Health Insurance - Indemnity | Admitting: Cardiology

## 2014-03-26 ENCOUNTER — Encounter: Payer: Self-pay | Admitting: Cardiology

## 2014-03-26 VITALS — BP 132/77 | HR 69 | Ht 61.0 in | Wt 197.0 lb

## 2014-03-26 DIAGNOSIS — E785 Hyperlipidemia, unspecified: Secondary | ICD-10-CM

## 2014-03-26 DIAGNOSIS — I25119 Atherosclerotic heart disease of native coronary artery with unspecified angina pectoris: Secondary | ICD-10-CM

## 2014-03-26 DIAGNOSIS — I10 Essential (primary) hypertension: Secondary | ICD-10-CM

## 2014-03-26 MED ORDER — CLOPIDOGREL BISULFATE 75 MG PO TABS: 75.0000 mg | ORAL_TABLET | Freq: Once | ORAL | Status: DC

## 2014-03-26 NOTE — Progress Notes (Signed)
Clinical Summary [name] is a 78 y.o.female seen today for follow up of the following medical problems.   1. CAD - prior NSTEMI in 2006, BMS to RCA. In stent restenosis in 2007 with another DES placed to RCA, she has been committed to life long plavix.  - Exercise MPI 06/2012: exercised 3 min 50 seconds, 7 METs, 89% THR, LVEF 69%, no ischemia.   - denies any recent chest pain, SOB, or DOE.  - compliant with meds. Her prior cardiologist has committed her to lifelong plavix and thus it has been contiued.   2. Hyperlipidemia - multiple statins caused leg pains, has not wanted to restart a statin. She has not been interested in alternative therapies such as zetia either.  - 08/2013 TC 198 HDL 59 LDL 122 TG 83   3. HTN - does not check regularly at home. - compliant with meds   Past Medical History  Diagnosis Date  . Dyslipidemia     On statin drug therapy  . Hypertension   . Chronic neck pain   . Acute GI bleeding     No recurrence and stable  . Postmenopausal bleeding     history  . Coronary artery disease     Status post non-ST elevation microinfarction with bare-metal stent to circumflex. 2006. Non-ST elevation myocardial infarction with in-stent restenosis 2007 requiring drug-eluting stent., Lifelong Plavix  . Abnormal cardiovascular function study      Cardiolite 2010 questionable lateral defect low risk ejection fraction 61%   . GERD (gastroesophageal reflux disease)      on H2 blocker     Allergies  Allergen Reactions  . Penicillins Swelling and Rash    REACTION: unspecified     Current Outpatient Prescriptions  Medication Sig Dispense Refill  . ALPRAZolam (XANAX) 0.5 MG tablet Take 0.5 mg by mouth as needed.     Marland Kitchen aspirin 81 MG tablet Take 81 mg by mouth daily.    . clopidogrel (PLAVIX) 75 MG tablet Take 1 tablet (75 mg total) by mouth once. 90 tablet 3  . estradiol (ESTRACE VAGINAL) 0.1 MG/GM vaginal cream Place 3.14 Applicatorfuls vaginally 3  (three) times a week. 42.5 g 12  . ibuprofen (ADVIL,MOTRIN) 200 MG tablet Take 200 mg by mouth every 6 (six) hours as needed.    Marland Kitchen lisinopril-hydrochlorothiazide (PRINZIDE,ZESTORETIC) 20-25 MG per tablet Take 1 tablet by mouth daily.    . metoprolol tartrate (LOPRESSOR) 25 MG tablet Take 25 mg by mouth 2 (two) times daily.    . nitroGLYCERIN (NITROSTAT) 0.4 MG SL tablet Place under the tongue every 5 (five) minutes as needed.    Marland Kitchen oxybutynin (DITROPAN) 5 MG tablet Take 1 tablet (5 mg total) by mouth at bedtime. To reduce morning incontinence 90 tablet 3  . ranitidine (ZANTAC) 75 MG tablet Take 75 mg by mouth at bedtime as needed.    . solifenacin (VESICARE) 10 MG tablet Take 1 tablet (10 mg total) by mouth daily. 30 tablet 12   No current facility-administered medications for this visit.     Past Surgical History  Procedure Laterality Date  . Cardiac catheterization  10/06. 02/07    left heart, with bare metal stent  . Cholecystectomy  1970  . Dilation and curettage of uterus    . Cervical polypectomy  06/13/03    endometrial  . Breast surgery Right 10/15/11    lump removed  . Skin cancer excision  2013     Allergies  Allergen  Reactions  . Penicillins Swelling and Rash    REACTION: unspecified      Family History  Problem Relation Age of Onset  . Cancer Mother     stomach  . Hypertension Father   . Heart disease Father   . Diabetes Father   . Hypertension Brother   . Hypertension Son   . Heart disease Maternal Grandmother   . Early death Maternal Grandfather      Social History Ms. Mervine reports that she has never smoked. She has never used smokeless tobacco. Ms. Asa reports that she does not drink alcohol.   Review of Systems CONSTITUTIONAL: No weight loss, fever, chills, weakness or fatigue.  HEENT: Eyes: No visual loss, blurred vision, double vision or yellow sclerae.No hearing loss, sneezing, congestion, runny nose or sore throat.  SKIN: No rash or  itching.  CARDIOVASCULAR: per HPI RESPIRATORY: No shortness of breath, cough or sputum.  GASTROINTESTINAL: No anorexia, nausea, vomiting or diarrhea. No abdominal pain or blood.  GENITOURINARY: No burning on urination, no polyuria NEUROLOGICAL: No headache, dizziness, syncope, paralysis, ataxia, numbness or tingling in the extremities. No change in bowel or bladder control.  MUSCULOSKELETAL: No muscle, back pain, joint pain or stiffness.  LYMPHATICS: No enlarged nodes. No history of splenectomy.  PSYCHIATRIC: No history of depression or anxiety.  ENDOCRINOLOGIC: No reports of sweating, cold or heat intolerance. No polyuria or polydipsia.  Marland Kitchen   Physical Examination p 69 bp 132/77 Wt 197 lbs BMI 37 Gen: resting comfortably, no acute distress HEENT: no scleral icterus, pupils equal round and reactive, no palptable cervical adenopathy,  CV: RRR, no m/r/g, no JVD, no carotid bruits Resp: Clear to auscultation bilaterally GI: abdomen is soft, non-tender, non-distended, normal bowel sounds, no hepatosplenomegaly MSK: extremities are warm, no edema.  Skin: warm, no rash Neuro:  no focal deficits Psych: appropriate affect   Diagnostic Studies 06/2012 Exercise MPI Exercise MPI 06/2012: exercised 3 min 50 seconds, 7 METs, 89% THR, LVEF 69%, no ischemia.     Assessment and Plan  1. CAD - no current symptoms, recent negative stress MPI - continue secondary prevention and risk factor modification - lifetime plavix per prior notes from prior cardiologist, will continue  2. HTN - at goal, continue current meds  3. Hyperlipidemia - prior statins have caused myalgias, she does not to try any other statins. She does not want to start zetia either. Will continue dietary and lifestyle modification.       Arnoldo Lenis, M.D.

## 2014-03-26 NOTE — Patient Instructions (Signed)
Continue all current medications. Your physician wants you to follow up in:  1 year.  You will receive a reminder letter in the mail one-two months in advance.  If you don't receive a letter, please call our office to schedule the follow up appointment   

## 2014-06-24 ENCOUNTER — Other Ambulatory Visit: Payer: Self-pay | Admitting: Obstetrics and Gynecology

## 2014-06-24 NOTE — Telephone Encounter (Signed)
Refilled x 4 months oxybutinin

## 2014-07-01 ENCOUNTER — Ambulatory Visit (INDEPENDENT_AMBULATORY_CARE_PROVIDER_SITE_OTHER): Payer: Medicare HMO | Admitting: Obstetrics and Gynecology

## 2014-07-01 ENCOUNTER — Encounter: Payer: Self-pay | Admitting: Obstetrics and Gynecology

## 2014-07-01 VITALS — BP 146/86 | Ht 62.0 in | Wt 195.0 lb

## 2014-07-01 DIAGNOSIS — N3941 Urge incontinence: Secondary | ICD-10-CM | POA: Diagnosis not present

## 2014-07-01 MED ORDER — OXYBUTYNIN CHLORIDE 5 MG PO TABS: ORAL_TABLET | ORAL | Status: DC

## 2014-07-01 NOTE — Progress Notes (Signed)
Patient ID: [name], female   DOB: Jul 26, 1935, 79 y.o.   MRN: [id number]  Pt here today for possible bladder drop. Pt states that she is not having any problems at all. Pt states that the medication is working perfectly.

## 2014-07-01 NOTE — Progress Notes (Signed)
Patient ID: [name], female   DOB: 1935/11/18, 79 y.o.   MRN: [id number]    [facility] Visit  Patient name: FLORDIA KASSEM MRN [id number]  Date of birth: November 23, 1935  CC & HPI:  Taunya DELLAMAE ROSAMILIA is a 79 y.o. female presenting today for follow-up discussion regarding urge incontinence and treatment plan. Pt was prescribed Oxybutynin with relief to her symptoms. She denies any complaints at this time.  ROS:  A complete 10 system review of systems was obtained and all systems are negative except as noted in the HPI and PMH.   Pertinent History Reviewed:   Reviewed Medical         Past Medical History  Diagnosis Date  . Dyslipidemia     On statin drug therapy  . Hypertension   . Chronic neck pain   . Acute GI bleeding     No recurrence and stable  . Postmenopausal bleeding     history  . Coronary artery disease     Status post non-ST elevation microinfarction with bare-metal stent to circumflex. 2006. Non-ST elevation myocardial infarction with in-stent restenosis 2007 requiring drug-eluting stent., Lifelong Plavix  . Abnormal cardiovascular function study      Cardiolite 2010 questionable lateral defect low risk ejection fraction 61%   . GERD (gastroesophageal reflux disease)      on H2 blocker                              Surgical Hx:    Past Surgical History  Procedure Laterality Date  . Cardiac catheterization  10/06. 02/07    left heart, with bare metal stent  . Cholecystectomy  1970  . Dilation and curettage of uterus    . Cervical polypectomy  06/13/03    endometrial  . Breast surgery Right 10/15/11    lump removed  . Skin cancer excision  2013   Medications: Reviewed & Updated - see associated section                       Current outpatient prescriptions:  .  ALPRAZolam (XANAX) 0.5 MG tablet, Take 0.5 mg by mouth as needed. , Disp: , Rfl:  .  aspirin 81 MG tablet, Take 81 mg by mouth daily., Disp: , Rfl:  .  clopidogrel (PLAVIX) 75 MG tablet,  Take 1 tablet (75 mg total) by mouth once., Disp: 90 tablet, Rfl: 3 .  estradiol (ESTRACE VAGINAL) 0.1 MG/GM vaginal cream, Place 4.62 Applicatorfuls vaginally 3 (three) times a week. (Patient not taking: Reported on 07/01/2014), Disp: 42.5 g, Rfl: 12 .  ibuprofen (ADVIL,MOTRIN) 200 MG tablet, Take 200 mg by mouth every 6 (six) hours as needed., Disp: , Rfl:  .  lisinopril-hydrochlorothiazide (PRINZIDE,ZESTORETIC) 20-25 MG per tablet, Take 1 tablet by mouth daily., Disp: , Rfl:  .  metoprolol tartrate (LOPRESSOR) 25 MG tablet, Take 25 mg by mouth 2 (two) times daily., Disp: , Rfl:  .  nitroGLYCERIN (NITROSTAT) 0.4 MG SL tablet, Place under the tongue every 5 (five) minutes as needed., Disp: , Rfl:  .  oxybutynin (DITROPAN) 5 MG tablet, TAKE 1 TABLET BY MOUTH AT BEDTIME. TO REDUCE MORNING INCONTINENCE, Disp: 90 tablet, Rfl: 3 .  ranitidine (ZANTAC) 75 MG tablet, Take 75 mg by mouth at bedtime as needed., Disp: , Rfl:    Social History: Reviewed -  reports that she has never smoked. She has never  used smokeless tobacco.  Objective Findings:  Vitals: Blood pressure 146/86, height 5\' 2"  (1.575 m), weight 195 lb (88.451 kg).  Physical Examination: General appearance - alert, well appearing, and in no distress, oriented to person, place, and time and overweight Mental status - alert, oriented to person, place, and time, normal mood, behavior, speech, dress, motor activity, and thought processes Abdomen - soft, nontender, nondistended, no masses or organomegaly   Assessment & Plan:   A:  1. Urge incontinence, resolved  P:  1. 1 year Rx for oxybutynin 2. followup with dr. Scotty Court prn     This chart was scribed for Jonnie Kind, MD by Tula Nakayama, ED Scribe. This patient was seen in conference room and the patient's care was started at 3:42 PM.   I personally performed the services described in this documentation, which was SCRIBED in my presence. The recorded information has been  reviewed and considered accurate. It has been edited as necessary during review. Jonnie Kind, MD

## 2014-07-01 NOTE — Progress Notes (Signed)
Patient ID: [name], female   DOB: 08-Sep-1935, 79 y.o.   MRN: [id number] Pt here today for bladder issues. Pt states that she is not currently having any issues and that the medication she was given really helps.

## 2014-07-02 DIAGNOSIS — N3941 Urge incontinence: Secondary | ICD-10-CM | POA: Insufficient documentation

## 2015-02-18 ENCOUNTER — Other Ambulatory Visit: Payer: Self-pay | Admitting: Cardiology

## 2015-03-19 ENCOUNTER — Encounter: Payer: Self-pay | Admitting: *Deleted

## 2015-03-20 ENCOUNTER — Encounter: Payer: Self-pay | Admitting: Cardiology

## 2015-03-21 ENCOUNTER — Ambulatory Visit (INDEPENDENT_AMBULATORY_CARE_PROVIDER_SITE_OTHER): Payer: Medicare HMO | Admitting: Cardiology

## 2015-03-21 ENCOUNTER — Encounter: Payer: Self-pay | Admitting: Cardiology

## 2015-03-21 VITALS — BP 148/76 | HR 80 | Ht 61.0 in | Wt 196.0 lb

## 2015-03-21 DIAGNOSIS — I10 Essential (primary) hypertension: Secondary | ICD-10-CM

## 2015-03-21 DIAGNOSIS — I25119 Atherosclerotic heart disease of native coronary artery with unspecified angina pectoris: Secondary | ICD-10-CM

## 2015-03-21 DIAGNOSIS — E785 Hyperlipidemia, unspecified: Secondary | ICD-10-CM | POA: Diagnosis not present

## 2015-03-21 MED ORDER — CLOPIDOGREL BISULFATE 75 MG PO TABS: 75.0000 mg | ORAL_TABLET | Freq: Every day | ORAL | Status: DC

## 2015-03-21 NOTE — Patient Instructions (Signed)
Continue all current medications. Follow up in  3 months 

## 2015-03-21 NOTE — Progress Notes (Signed)
Patient ID: [name], female   DOB: 06-Apr-1936, 79 y.o.   MRN: [id number]     Clinical Summary [name] is a 79 y.o.female seen today for follow up of the following medical problems.   1. CAD - prior NSTEMI in 2006, BMS to RCA. In stent restenosis in 2007 with another DES placed to RCA, she has been committed to life long plavix.  - Exercise MPI 06/2012: exercised 3 min 50 seconds, 7 METs, 89% THR, LVEF 69%, no ischemia.     - has had some chest pain recently. Started few weeks ago. Heaviness in chest, 3/10. Tends to occur at rest. No other associated symptoms. Not postional. Lasts just a few seconds. 4-5 episodes over the last 2 weeks. No relation to food - stable DOE that is unchanged - notes some occasional LE edema -compliant with meds - joined silver sneakers, exercising 2-3 times a week without symptoms   2. Hyperlipidemia - multiple statins caused leg pains, has not wanted to restart a statin. She has not been interested in alternative therapies such as zetia either.  - 08/2013 TC 198 HDL 59 LDL 122 TG 83   3. HTN - does not check regularly at home.  - compliant with meds, however has not taken meds yet today Past Medical History  Diagnosis Date  . Dyslipidemia     On statin drug therapy  . Hypertension   . Chronic neck pain   . Acute GI bleeding     No recurrence and stable  . Postmenopausal bleeding     history  . Coronary artery disease     Status post non-ST elevation microinfarction with bare-metal stent to circumflex. 2006. Non-ST elevation myocardial infarction with in-stent restenosis 2007 requiring drug-eluting stent., Lifelong Plavix  . Abnormal cardiovascular function study      Cardiolite 2010 questionable lateral defect low risk ejection fraction 61%   . GERD (gastroesophageal reflux disease)      on H2 blocker     Allergies  Allergen Reactions  . Penicillins Swelling and Rash    REACTION: unspecified     Current Outpatient  Prescriptions  Medication Sig Dispense Refill  . ALPRAZolam (XANAX) 0.5 MG tablet Take 0.5 mg by mouth as needed.     Marland Kitchen aspirin 81 MG tablet Take 81 mg by mouth daily.    . clopidogrel (PLAVIX) 75 MG tablet TAKE 1 TABLET BY MOUTH EVERY DAY 90 tablet 0  . estradiol (ESTRACE VAGINAL) 0.1 MG/GM vaginal cream Place [id number] Applicatorfuls vaginally 3 (three) times a week. (Patient not taking: Reported on 07/01/2014) 42.5 g 12  . ibuprofen (ADVIL,MOTRIN) 200 MG tablet Take 200 mg by mouth every 6 (six) hours as needed.    Marland Kitchen lisinopril-hydrochlorothiazide (PRINZIDE,ZESTORETIC) 20-25 MG per tablet Take 1 tablet by mouth daily.    . metoprolol tartrate (LOPRESSOR) 25 MG tablet Take 25 mg by mouth 2 (two) times daily.    . nitroGLYCERIN (NITROSTAT) 0.4 MG SL tablet Place under the tongue every 5 (five) minutes as needed.    Marland Kitchen oxybutynin (DITROPAN) 5 MG tablet TAKE 1 TABLET BY MOUTH AT BEDTIME. TO REDUCE MORNING INCONTINENCE 90 tablet 3  . ranitidine (ZANTAC) 75 MG tablet Take 75 mg by mouth at bedtime as needed.     No current facility-administered medications for this visit.     Past Surgical History  Procedure Laterality Date  . Cardiac catheterization  10/06. 02/07    left heart, with bare metal stent  . Cholecystectomy  1970  . Dilation and curettage of uterus    . Cervical polypectomy  06/13/03    endometrial  . Breast surgery Right 10/15/11    lump removed  . Skin cancer excision  2013     Allergies  Allergen Reactions  . Penicillins Swelling and Rash    REACTION: unspecified      Family History  Problem Relation Age of Onset  . Cancer Mother     stomach  . Hypertension Father   . Heart disease Father   . Diabetes Father   . Hypertension Brother   . Hypertension Son   . Heart disease Maternal Grandmother   . Early death Maternal Grandfather      Social History Ms. Huet reports that she has never smoked. She has never used smokeless tobacco. Ms. Arnall reports that she  does not drink alcohol.   Review of Systems CONSTITUTIONAL: No weight loss, fever, chills, weakness or fatigue.  HEENT: Eyes: No visual loss, blurred vision, double vision or yellow sclerae.No hearing loss, sneezing, congestion, runny nose or sore throat.  SKIN: No rash or itching.  CARDIOVASCULAR: per hpi RESPIRATORY: No shortness of breath, cough or sputum.  GASTROINTESTINAL: No anorexia, nausea, vomiting or diarrhea. No abdominal pain or blood.  GENITOURINARY: No burning on urination, no polyuria NEUROLOGICAL: No headache, dizziness, syncope, paralysis, ataxia, numbness or tingling in the extremities. No change in bowel or bladder control.  MUSCULOSKELETAL: No muscle, back pain, joint pain or stiffness.  LYMPHATICS: No enlarged nodes. No history of splenectomy.  PSYCHIATRIC: No history of depression or anxiety.  ENDOCRINOLOGIC: No reports of sweating, cold or heat intolerance. No polyuria or polydipsia.  Marland Kitchen   Physical Examination Filed Vitals:   03/21/15 0951  BP: 148/76  Pulse: 80   Filed Vitals:   03/21/15 0951  Height: 5\' 1"  (1.549 m)  Weight: 196 lb (88.905 kg)    Gen: resting comfortably, no acute distress HEENT: no scleral icterus, pupils equal round and reactive, no palptable cervical adenopathy,  CV: RRR, no m/r/g, no jvd Resp: Clear to auscultation bilaterally GI: abdomen is soft, non-tender, non-distended, normal bowel sounds, no hepatosplenomegaly MSK: extremities are warm, no edema.  Skin: warm, no rash Neuro:  no focal deficits Psych: appropriate affect   Diagnostic Studies 06/2012 Exercise MPI Exercise MPI 06/2012: exercised 3 min 50 seconds, 7 METs, 89% THR, LVEF 69%, no ischemia.     Assessment and Plan   1. CAD - atypical symptoms recently. She actually has increased her activity working out with silver sneakers with no symptoms during those sessions of exertion - will continue to follow clinically  2. HTN - at goal, continue current meds  3.  Hyperlipidemia - prior statins have caused myalgias, she does not to try any other statins. She does not want to start zetia either. Will continue dietary and lifestyle modification.    F/u 3 months   Arnoldo Lenis, M.D.

## 2015-03-25 ENCOUNTER — Encounter: Payer: Self-pay | Admitting: *Deleted

## 2015-06-13 ENCOUNTER — Encounter: Payer: Self-pay | Admitting: *Deleted

## 2015-06-13 ENCOUNTER — Ambulatory Visit (INDEPENDENT_AMBULATORY_CARE_PROVIDER_SITE_OTHER): Payer: Medicare HMO | Admitting: Cardiology

## 2015-06-13 ENCOUNTER — Encounter: Payer: Self-pay | Admitting: Cardiology

## 2015-06-13 VITALS — BP 130/74 | HR 73 | Ht 61.0 in | Wt 199.0 lb

## 2015-06-13 DIAGNOSIS — E785 Hyperlipidemia, unspecified: Secondary | ICD-10-CM

## 2015-06-13 DIAGNOSIS — R079 Chest pain, unspecified: Secondary | ICD-10-CM

## 2015-06-13 DIAGNOSIS — I25119 Atherosclerotic heart disease of native coronary artery with unspecified angina pectoris: Secondary | ICD-10-CM

## 2015-06-13 DIAGNOSIS — I10 Essential (primary) hypertension: Secondary | ICD-10-CM

## 2015-06-13 NOTE — Progress Notes (Signed)
Patient ID: [name], female   DOB: 05/06/1935, 80 y.o.   MRN: [id number]     Clinical SummaryMs. White is a 80 y.o.female seen today for follow up of the following medical problems.   1. CAD - prior NSTEMI in 2006, BMS to RCA. In stent restenosis in 2007 with another DES placed to RCA, she has been committed to life long plavix.  - Exercise MPI 06/2012: exercised 3 min 50 seconds, 7 METs, 89% THR, LVEF 69%, no ischemia.   - notes some recent new DOE with walking about 1/2 block. SOB along with tightness in chest. Left sided tightness, 5/10. No other associated symptoms. Resolved with rest. Happened total of 4-5 times.    2. Hyperlipidemia - multiple statins caused leg pains, has not wanted to restart a statin. She has not been interested in alternative therapies such as zetia either.    3. HTN - does not check regularly at home.  - compliant with meds Past Medical History  Diagnosis Date  . Dyslipidemia     On statin drug therapy  . Hypertension   . Chronic neck pain   . Acute GI bleeding     No recurrence and stable  . Postmenopausal bleeding     history  . Coronary artery disease     Status post non-ST elevation microinfarction with bare-metal stent to circumflex. 2006. Non-ST elevation myocardial infarction with in-stent restenosis 2007 requiring drug-eluting stent., Lifelong Plavix  . Abnormal cardiovascular function study      Cardiolite 2010 questionable lateral defect low risk ejection fraction 61%   . GERD (gastroesophageal reflux disease)      on H2 blocker     Allergies  Allergen Reactions  . Penicillins Swelling and Rash    REACTION: unspecified     Current Outpatient Prescriptions  Medication Sig Dispense Refill  . ALPRAZolam (XANAX) 0.5 MG tablet Take 0.5 mg by mouth as needed.     Marland Kitchen aspirin 81 MG tablet Take 81 mg by mouth daily.    . clopidogrel (PLAVIX) 75 MG tablet Take 1 tablet (75 mg total) by mouth daily. 90 tablet 3  . ibuprofen  (ADVIL,MOTRIN) 800 MG tablet Take 1 tablet by mouth 3 (three) times daily as needed.    Marland Kitchen lisinopril-hydrochlorothiazide (PRINZIDE,ZESTORETIC) 20-25 MG per tablet Take 1 tablet by mouth daily.    . metoprolol (LOPRESSOR) 50 MG tablet Take 1 tablet by mouth 2 (two) times daily.    . nitroGLYCERIN (NITROSTAT) 0.4 MG SL tablet Place under the tongue every 5 (five) minutes as needed.    . ranitidine (ZANTAC) 75 MG tablet Take 75 mg by mouth at bedtime as needed.     No current facility-administered medications for this visit.     Past Surgical History  Procedure Laterality Date  . Cardiac catheterization  10/06. 02/07    left heart, with bare metal stent  . Cholecystectomy  1970  . Dilation and curettage of uterus    . Cervical polypectomy  06/13/03    endometrial  . Breast surgery Right 10/15/11    lump removed  . Skin cancer excision  2013     Allergies  Allergen Reactions  . Penicillins Swelling and Rash    REACTION: unspecified      Family History  Problem Relation Age of Onset  . Cancer Mother     stomach  . Hypertension Father   . Heart disease Father   . Diabetes Father   . Hypertension Brother   .  Hypertension Son   . Heart disease Maternal Grandmother   . Early death Maternal Grandfather      Social History [name] reports that she has never smoked. She has never used smokeless tobacco. [name] reports that she does not drink alcohol.   Review of Systems CONSTITUTIONAL: No weight loss, fever, chills, weakness or fatigue.  HEENT: Eyes: No visual loss, blurred vision, double vision or yellow sclerae.No hearing loss, sneezing, congestion, runny nose or sore throat.  SKIN: No rash or itching.  CARDIOVASCULAR: per HPI RESPIRATORY: No  cough or sputum.  GASTROINTESTINAL: No anorexia, nausea, vomiting or diarrhea. No abdominal pain or blood.  GENITOURINARY: No burning on urination, no polyuria NEUROLOGICAL: No headache, dizziness, syncope, paralysis,  ataxia, numbness or tingling in the extremities. No change in bowel or bladder control.  MUSCULOSKELETAL: No muscle, back pain, joint pain or stiffness.  LYMPHATICS: No enlarged nodes. No history of splenectomy.  PSYCHIATRIC: No history of depression or anxiety.  ENDOCRINOLOGIC: No reports of sweating, cold or heat intolerance. No polyuria or polydipsia.  Marland Kitchen   Physical Examination Filed Vitals:   06/13/15 0824  BP: 130/74  Pulse: 73   Filed Vitals:   06/13/15 0824  Height: 5\' 1"  (1.549 m)  Weight: 199 lb (90.266 kg)    Gen: resting comfortably, no acute distress HEENT: no scleral icterus, pupils equal round and reactive, no palptable cervical adenopathy,  CV: RRR, no m/r/g, no jvd Resp: Clear to auscultation bilaterally GI: abdomen is soft, non-tender, non-distended, normal bowel sounds, no hepatosplenomegaly MSK: extremities are warm, no edema.  Skin: warm, no rash Neuro:  no focal deficits Psych: appropriate affect   Diagnostic Studies 06/2012 Exercise MPI Exercise MPI 06/2012: exercised 3 min 50 seconds, 7 METs, 89% THR, LVEF 69%, no ischemia.      Assessment and Plan  1. CAD - recent chest pain symptoms, we will obtain an exercise nuclear stress test with modified bruce protocol  2. HTN - at goal, we will continue current meds  3. Hyperlipidemia - intolerant to statins, she has not wanted to try zetia or pcsk9 inhibitors - continue dietary modification  F/u 1 month       Arnoldo Lenis, M.D.

## 2015-06-13 NOTE — Patient Instructions (Signed)
Your physician has requested that you have an exercise stress myoview. For further information please visit [email or web address]. Please follow instruction sheet, as given. Office will contact with results via phone or letter.   Continue all current medications. Follow up in  1 month.

## 2015-06-20 ENCOUNTER — Encounter (HOSPITAL_COMMUNITY)
Admission: RE | Admit: 2015-06-20 | Discharge: 2015-06-20 | Disposition: A | Discharge status: Home / Self Care | Payer: Medicare HMO | Source: Ambulatory Visit | Attending: Cardiology | Admitting: Cardiology

## 2015-06-20 ENCOUNTER — Inpatient Hospital Stay (HOSPITAL_COMMUNITY): Admission: RE | Admit: 2015-06-20 | Payer: Medicare HMO | Source: Ambulatory Visit

## 2015-06-20 ENCOUNTER — Encounter (HOSPITAL_COMMUNITY): Payer: Self-pay

## 2015-06-20 DIAGNOSIS — R079 Chest pain, unspecified: Secondary | ICD-10-CM | POA: Insufficient documentation

## 2015-06-20 HISTORY — DX: Malignant (primary) neoplasm, unspecified: C80.1

## 2015-06-20 LAB — NM MYOCAR MULTI W/SPECT W/WALL MOTION / EF
Estimated workload: 4.6 METS
Exercise duration (min): 8 min
Exercise duration (sec): 21 s
LV dias vol: 70 mL (ref 46–106)
LV sys vol: 20 mL
MPHR: 143 {beats}/min
Peak HR: 123 {beats}/min
Percent HR: 86 %
RATE: 0.35
RPE: 11
Rest HR: 66 {beats}/min
SDS: 1
SRS: 3
SSS: 4
TID: 1.15

## 2015-06-20 MED ORDER — TECHNETIUM TC 99M SESTAMIBI GENERIC - CARDIOLITE
30.0000 | Freq: Once | INTRAVENOUS | Status: AC | PRN
  Administered 2015-06-20: 31.5 via INTRAVENOUS

## 2015-06-20 MED ORDER — TECHNETIUM TC 99M SESTAMIBI - CARDIOLITE
10.0000 | Freq: Once | INTRAVENOUS | Status: AC | PRN
  Administered 2015-06-20: 9 via INTRAVENOUS

## 2015-06-24 ENCOUNTER — Ambulatory Visit: Payer: Medicare HMO | Admitting: Cardiology

## 2015-06-25 ENCOUNTER — Telehealth: Payer: Self-pay | Admitting: *Deleted

## 2015-06-25 NOTE — Telephone Encounter (Signed)
-----   Message from Laurine Blazer, LPN sent at [id number]  1:07 PM EDT -----   ----- Message -----    From: Arnoldo Lenis, MD    Sent: 06/23/2015  11:04 AM      To: Laurine Blazer, LPN  STress test looks good, no evidence of new blockages. We will discuss further at our f/u  Zandra Abts MD

## 2015-06-25 NOTE — Telephone Encounter (Signed)
Pt aware, routed to pcp, confirmed 3/31 appt

## 2015-07-11 ENCOUNTER — Ambulatory Visit (INDEPENDENT_AMBULATORY_CARE_PROVIDER_SITE_OTHER): Payer: Medicare HMO | Admitting: Cardiology

## 2015-07-11 ENCOUNTER — Encounter: Payer: Self-pay | Admitting: Cardiology

## 2015-07-11 VITALS — BP 124/68 | HR 64 | Ht 62.0 in | Wt 196.0 lb

## 2015-07-11 DIAGNOSIS — I10 Essential (primary) hypertension: Secondary | ICD-10-CM

## 2015-07-11 DIAGNOSIS — E785 Hyperlipidemia, unspecified: Secondary | ICD-10-CM

## 2015-07-11 DIAGNOSIS — R600 Localized edema: Secondary | ICD-10-CM

## 2015-07-11 DIAGNOSIS — I2510 Atherosclerotic heart disease of native coronary artery without angina pectoris: Secondary | ICD-10-CM | POA: Diagnosis not present

## 2015-07-11 MED ORDER — FUROSEMIDE 20 MG PO TABS: ORAL_TABLET | ORAL | Status: DC

## 2015-07-11 NOTE — Patient Instructions (Signed)
Your physician wants you to follow-up in: [place] [address] [facility]AN[facility] You will receive a reminder letter in the mail two months in advance. If you don't receive a letter, please call our office to schedule the follow-up appointment.  Your physician has recommended you make the following change in your medication:   START LASIX 20 MG DAILY AS NEEDED FOR SWELLING  Thank you for choosing [facility]!!

## 2015-07-11 NOTE — Progress Notes (Signed)
Patient ID: [name], female   DOB: [birth year], 80 y.o.   MRN: [id number]     Clinical Summary [name] is a 80 y.o.female seen today for follow up of the following medical problems  1. CAD - prior NSTEMI in 2006, BMS to RCA. In stent restenosis in 2007 with another DES placed to RCA, she has been committed to life long plavix.  - Exercise MPI 06/2012: exercised 3 min 50 seconds, 7 METs, 89% THR, LVEF 69%, no ischemia.  - last visit she reported some SOB and chest pain  - since last visit she completed a Lexiscan 06/20/15 that showed no ischemia - since last visit symptoms have resolved   2. Hyperlipidemia - multiple statins caused leg pains, has not wanted to restart a statin. She has not been interested in alternative therapies such as zetia or pcsk9 inhibitors   3. HTN - does not check regularly at home.  - compliant with meds  4. Leg edema - reports intermittent leg swelling. No new LE edema, orthopnea, or pnd Past Medical History  Diagnosis Date  . Dyslipidemia     On statin drug therapy  . Hypertension   . Chronic neck pain   . Acute GI bleeding     No recurrence and stable  . Postmenopausal bleeding     history  . Coronary artery disease     Status post non-ST elevation microinfarction with bare-metal stent to circumflex. 2006. Non-ST elevation myocardial infarction with in-stent restenosis 2007 requiring drug-eluting stent., Lifelong Plavix  . Abnormal cardiovascular function study      Cardiolite 2010 questionable lateral defect low risk ejection fraction 61%   . GERD (gastroesophageal reflux disease)      on H2 blocker  . Cancer (HCC)     Skin     Allergies  Allergen Reactions  . Penicillins Swelling and Rash    REACTION: unspecified     Current Outpatient Prescriptions  Medication Sig Dispense Refill  . ALPRAZolam (XANAX) 0.5 MG tablet Take 0.5 mg by mouth as needed.     Marland Kitchen aspirin 81 MG tablet Take 81 mg by mouth daily.    . clopidogrel  (PLAVIX) 75 MG tablet Take 1 tablet (75 mg total) by mouth daily. 90 tablet 3  . ibuprofen (ADVIL,MOTRIN) 800 MG tablet Take 1 tablet by mouth 3 (three) times daily as needed.    Marland Kitchen lisinopril-hydrochlorothiazide (PRINZIDE,ZESTORETIC) 20-25 MG per tablet Take 1 tablet by mouth daily.    . metoprolol (LOPRESSOR) 50 MG tablet Take 1 tablet by mouth 2 (two) times daily.    . nitroGLYCERIN (NITROSTAT) 0.4 MG SL tablet Place under the tongue every 5 (five) minutes as needed.    . ranitidine (ZANTAC) 75 MG tablet Take 75 mg by mouth at bedtime as needed.     No current facility-administered medications for this visit.     Past Surgical History  Procedure Laterality Date  . Cardiac catheterization  10/06. 02/07    left heart, with bare metal stent  . Cholecystectomy  1970  . Dilation and curettage of uterus    . Cervical polypectomy  06/13/03    endometrial  . Breast surgery Right 10/15/11    lump removed  . Skin cancer excision  2013     Allergies  Allergen Reactions  . Penicillins Swelling and Rash    REACTION: unspecified      Family History  Problem Relation Age of Onset  . Cancer Mother  stomach  . Hypertension Father   . Heart disease Father   . Diabetes Father   . Hypertension Brother   . Hypertension Son   . Heart disease Maternal Grandmother   . Early death Maternal Grandfather      Social History [name] reports that she has never smoked. She has never used smokeless tobacco. [name] reports that she does not drink alcohol.   Review of Systems CONSTITUTIONAL: No weight loss, fever, chills, weakness or fatigue.  HEENT: Eyes: No visual loss, blurred vision, double vision or yellow sclerae.No hearing loss, sneezing, congestion, runny nose or sore throat.  SKIN: No rash or itching.  CARDIOVASCULAR: per hpi RESPIRATORY: No shortness of breath, cough or sputum.  GASTROINTESTINAL: No anorexia, nausea, vomiting or diarrhea. No abdominal pain or blood.    GENITOURINARY: No burning on urination, no polyuria NEUROLOGICAL: No headache, dizziness, syncope, paralysis, ataxia, numbness or tingling in the extremities. No change in bowel or bladder control.  MUSCULOSKELETAL: No muscle, back pain, joint pain or stiffness.  LYMPHATICS: No enlarged nodes. No history of splenectomy.  PSYCHIATRIC: No history of depression or anxiety.  ENDOCRINOLOGIC: No reports of sweating, cold or heat intolerance. No polyuria or polydipsia.  Marland Kitchen   Physical Examination Filed Vitals:   07/11/15 0824  BP: 124/68  Pulse: 64   Filed Vitals:   07/11/15 0824  Height: 5\' 2"  (1.575 m)  Weight: 196 lb (88.905 kg)    Gen: resting comfortably, no acute distress HEENT: no scleral icterus, pupils equal round and reactive, no palptable cervical adenopathy,  CV: RRR, no m/r/g, no jvd Resp: Clear to auscultation bilaterally GI: abdomen is soft, non-tender, non-distended, normal bowel sounds, no hepatosplenomegaly MSK: extremities are warm, no edema.  Skin: warm, no rash Neuro:  no focal deficits Psych: appropriate affect   Diagnostic Studies 06/2012 Exercise MPI Exercise MPI 06/2012: exercised 3 min 50 seconds, 7 METs, 89% THR, LVEF 69%, no ischemia.    06/2015 Lexiscan MPI  The study is normal.  This is a low risk study.  Nuclear stress EF: 71%.    Assessment and Plan  1. CAD - recent Lexiscan without ischemia. Symptoms of chest pain have resolved - continue current meds  2. HTN - at goal, continue current bp meds  3. Hyperlipidemia - intolerant to statins, she has not wanted to try zetia or pcsk9 inhibitors  4. LE edema - occasional leg swelling. We will start prn lasix.    F/u 6 months    Arnoldo Lenis, M.D.

## 2016-01-22 ENCOUNTER — Ambulatory Visit (INDEPENDENT_AMBULATORY_CARE_PROVIDER_SITE_OTHER): Payer: Medicare HMO | Admitting: Orthopaedic Surgery

## 2016-01-22 DIAGNOSIS — M25511 Pain in right shoulder: Secondary | ICD-10-CM

## 2017-05-16 ENCOUNTER — Ambulatory Visit: Payer: Medicare HMO | Admitting: Cardiology

## 2017-05-16 ENCOUNTER — Encounter: Payer: Self-pay | Admitting: *Deleted

## 2017-05-16 VITALS — BP 124/72 | HR 72 | Ht 61.5 in | Wt 195.0 lb

## 2017-05-16 DIAGNOSIS — R600 Localized edema: Secondary | ICD-10-CM | POA: Diagnosis not present

## 2017-05-16 DIAGNOSIS — E785 Hyperlipidemia, unspecified: Secondary | ICD-10-CM | POA: Diagnosis not present

## 2017-05-16 DIAGNOSIS — I10 Essential (primary) hypertension: Secondary | ICD-10-CM

## 2017-05-16 DIAGNOSIS — I2510 Atherosclerotic heart disease of native coronary artery without angina pectoris: Secondary | ICD-10-CM | POA: Diagnosis not present

## 2017-05-16 NOTE — Patient Instructions (Signed)
Your physician wants you to follow-up in: [place] will receive a reminder letter in the mail two months in advance. If you don't receive a letter, please call our office to schedule the follow-up appointment.  Your physician recommends that you continue on your current medications as directed. Please refer to the Current Medication list given to you today.  Your physician recommends that you return for lab work LIPIDS - PLEASE FAST 6-8 HOURS PRIOR TO LAB WORK  Thank you for choosing [facility]!!

## 2017-05-16 NOTE — Progress Notes (Signed)
Clinical Summary [name] is a 82 y.o.female seen today for follow up of the following medical problems  1. CAD - prior NSTEMI in 2006, BMS to RCA. In stent restenosis in 2007 with another DES placed to RCA, she has been committed to life long plavix.  - Exercise MPI 06/2012: exercised 3 min 50 seconds, 7 METs, 89% THR, LVEF 69%, no ischemia.  - last visit she reported some SOB and chest pain  - she completed a Lexiscan 06/20/15 that showed no ischemia  - no recent chest pain - compliant with meds   2. Hyperlipidemia - multiple statins caused leg pains, has not wanted to restart a statin. She has not been interested in alternative therapies such as zetia or pcsk9 inhibitors - workign on dietary modification  3. HTN - compliant with meds  4. Leg edema - reports intermittent leg swelling. No new LE edema, orthopnea, or pnd - takes lasix about 1-2 times per week.      Past Medical History:  Diagnosis Date  . Abnormal cardiovascular function study     Cardiolite 2010 questionable lateral defect low risk ejection fraction 61%   . Acute GI bleeding    No recurrence and stable  . Cancer (HCC)    Skin  . Chronic neck pain   . Coronary artery disease    Status post non-ST elevation microinfarction with bare-metal stent to circumflex. 2006. Non-ST elevation myocardial infarction with in-stent restenosis 2007 requiring drug-eluting stent., Lifelong Plavix  . Dyslipidemia    On statin drug therapy  . GERD (gastroesophageal reflux disease)     on H2 blocker  . Hypertension   . Postmenopausal bleeding    history     Allergies  Allergen Reactions  . Penicillins Swelling and Rash    REACTION: unspecified     Current Outpatient Medications  Medication Sig Dispense Refill  . ALPRAZolam (XANAX) 0.5 MG tablet Take 0.5 mg by mouth as needed.     Marland Kitchen aspirin 81 MG tablet Take 81 mg by mouth daily.    . clopidogrel (PLAVIX) 75 MG tablet Take 1 tablet (75 mg total)  by mouth daily. 90 tablet 3  . furosemide (LASIX) 20 MG tablet Take one tab daily as needed 90 tablet 3  . ibuprofen (ADVIL,MOTRIN) 800 MG tablet Take 1 tablet by mouth 3 (three) times daily as needed.    Marland Kitchen lisinopril-hydrochlorothiazide (PRINZIDE,ZESTORETIC) 20-25 MG per tablet Take 1 tablet by mouth daily.    . metoprolol (LOPRESSOR) 50 MG tablet Take 1 tablet by mouth 2 (two) times daily.    . nitroGLYCERIN (NITROSTAT) 0.4 MG SL tablet Place under the tongue every 5 (five) minutes as needed.    . ranitidine (ZANTAC) 75 MG tablet Take 75 mg by mouth at bedtime as needed.     No current facility-administered medications for this visit.      Past Surgical History:  Procedure Laterality Date  . BREAST SURGERY Right 10/15/11   lump removed  . CARDIAC CATHETERIZATION  10/06. 02/07   left heart, with bare metal stent  . CERVICAL POLYPECTOMY  06/13/03   endometrial  . CHOLECYSTECTOMY  1970  . DILATION AND CURETTAGE OF UTERUS    . SKIN CANCER EXCISION  2013     Allergies  Allergen Reactions  . Penicillins Swelling and Rash    REACTION: unspecified      Family History  Problem Relation Age of Onset  . Cancer Mother  stomach  . Hypertension Father   . Heart disease Father   . Diabetes Father   . Hypertension Brother   . Hypertension Son   . Heart disease Maternal Grandmother   . Early death Maternal Grandfather      Social History Ms. Chesbro reports that  has never smoked. she has never used smokeless tobacco. Ms. Koble reports that she does not drink alcohol.   Review of Systems CONSTITUTIONAL: No weight loss, fever, chills, weakness or fatigue.  HEENT: Eyes: No visual loss, blurred vision, double vision or yellow sclerae.No hearing loss, sneezing, congestion, runny nose or sore throat.  SKIN: No rash or itching.  CARDIOVASCULAR: per hpi RESPIRATORY: No shortness of breath, cough or sputum.  GASTROINTESTINAL: No anorexia, nausea, vomiting or diarrhea. No  abdominal pain or blood.  GENITOURINARY: No burning on urination, no polyuria NEUROLOGICAL: No headache, dizziness, syncope, paralysis, ataxia, numbness or tingling in the extremities. No change in bowel or bladder control.  MUSCULOSKELETAL: No muscle, back pain, joint pain or stiffness.  LYMPHATICS: No enlarged nodes. No history of splenectomy.  PSYCHIATRIC: No history of depression or anxiety.  ENDOCRINOLOGIC: No reports of sweating, cold or heat intolerance. No polyuria or polydipsia.  Marland Kitchen   Physical Examination Vitals:   05/16/17 1044  BP: 124/72  Pulse: 72  SpO2: 98%   Vitals:   05/16/17 1044  Weight: 195 lb (88.5 kg)  Height: 5' 1.5" (1.562 m)    Gen: resting comfortably, no acute distress HEENT: no scleral icterus, pupils equal round and reactive, no palptable cervical adenopathy,  CV: RRR, no m/r/g, no jvd Resp: Clear to auscultation bilaterally GI: abdomen is soft, non-tender, non-distended, normal bowel sounds, no hepatosplenomegaly MSK: extremities are warm, no edema.  Skin: warm, no rash Neuro:  no focal deficits Psych: appropriate affect   Diagnostic Studies 06/2012 Exercise MPI Exercise MPI 06/2012: exercised 3 min 50 seconds, 7 METs, 89% THR, LVEF 69%, no ischemia.    06/2015 Lexiscan MPI  The study is normal.  This is a low risk study.  Nuclear stress EF: 71%.     Assessment and Plan  1. CAD - recent Lexiscan without ischemia.  - no recent symptoms - continue current meds  2. HTN - bp at goal, continue current meds  3. Hyperlipidemia - intolerant to statins, she has not wanted to try zetia or pcsk9 inhibitors - we will repeat lipid panel.   4. LE edema - continue prn lasix   F/u 6 months      Arnoldo Lenis, M.D

## 2017-05-19 ENCOUNTER — Telehealth: Payer: Self-pay | Admitting: *Deleted

## 2017-05-19 MED ORDER — EZETIMIBE 10 MG PO TABS: 10.0000 mg | ORAL_TABLET | Freq: Every day | ORAL | 1 refills | Status: DC

## 2017-05-19 NOTE — Telephone Encounter (Signed)
Pt aware and voiced understanding - zetia sent to [facility] as requested - routed to pcp

## 2017-05-19 NOTE — Telephone Encounter (Signed)
-----   Message from Arnoldo Lenis, MD sent at 05/19/2017  2:22 PM EST ----- Cholesterol too high, I recommend starting zetia 10mg  daily  Zandra Abts MD

## 2017-05-20 ENCOUNTER — Encounter: Payer: Self-pay | Admitting: Cardiology

## 2017-07-12 ENCOUNTER — Ambulatory Visit: Payer: Medicare HMO | Admitting: Cardiology

## 2017-07-21 IMAGING — NM NM MYOCAR MULTI W/SPECT W/WALL MOTION & EF
2 series · 12 of 12 positions shown · non-contrast
Comparison: none

[Series 1: rest · 8.28mm/px · 6 of 64 frames shown]
[frame 6/64]
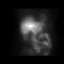
[frame 16/64]
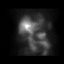
[frame 27/64]
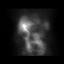
[frame 38/64]
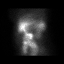
[frame 48/64]
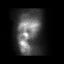
[frame 59/64]
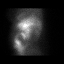

[Series 2: stress gated · 8.28mm/px · 6 of 64 frames shown]
[frame 6/64]
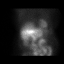
[frame 16/64]
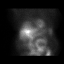
[frame 27/64]
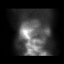
[frame 38/64]
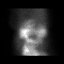
[frame 48/64]
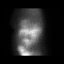
[frame 59/64]
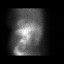

[12 of 12 positions shown; findings below may reference images not displayed]

Canned report from images found in remote index.

Refer to host system for actual result text.

## 2017-07-28 ENCOUNTER — Telehealth: Payer: Self-pay | Admitting: Cardiology

## 2017-07-28 DIAGNOSIS — E785 Hyperlipidemia, unspecified: Secondary | ICD-10-CM

## 2017-07-28 NOTE — Telephone Encounter (Signed)
Patient has found good price for  the last prescription that was added She started taking it 06/16/17.

## 2017-07-28 NOTE — Telephone Encounter (Signed)
Patient states she started taking Zetia on 06/16/17 and would like to know how her cholesterol is doing.

## 2017-07-29 NOTE — Telephone Encounter (Signed)
Patient notified and lab sheet mailed

## 2017-07-29 NOTE — Addendum Note (Signed)
Addended by: Levonne Hubert on: 07/29/2017 11:24 AM   Modules accepted: Orders

## 2017-07-29 NOTE — Telephone Encounter (Signed)
Can order lipid panel for next month, usually takes a few months for the medicine take effect  J Malayna Noori MD

## 2017-09-08 ENCOUNTER — Telehealth: Payer: Self-pay | Admitting: *Deleted

## 2017-09-08 NOTE — Telephone Encounter (Signed)
-----   Message from Arnoldo Lenis, MD sent at 09/08/2017  4:05 PM EDT ----- Cholesterol looks ok, no changes  J BrancH MD

## 2017-09-08 NOTE — Telephone Encounter (Signed)
Pt aware.

## 2018-05-18 ENCOUNTER — Ambulatory Visit: Payer: Medicare HMO | Admitting: Cardiology

## 2018-05-18 ENCOUNTER — Encounter: Payer: Self-pay | Admitting: Cardiology

## 2018-05-18 VITALS — BP 148/75 | HR 72 | Ht 61.0 in | Wt 191.8 lb

## 2018-05-18 DIAGNOSIS — I2510 Atherosclerotic heart disease of native coronary artery without angina pectoris: Secondary | ICD-10-CM | POA: Diagnosis not present

## 2018-05-18 DIAGNOSIS — I10 Essential (primary) hypertension: Secondary | ICD-10-CM | POA: Diagnosis not present

## 2018-05-18 DIAGNOSIS — E782 Mixed hyperlipidemia: Secondary | ICD-10-CM

## 2018-05-18 NOTE — Progress Notes (Signed)
Clinical Summary [name] is a 83 y.o.female seen today for follow up of the following medical problems  1. CAD - prior NSTEMI in 2006, BMS to RCA. In stent restenosis in 2007 with another DES placed to RCA, she has been committed to life long plavix.  - Exercise MPI 06/2012: exercised 3 min 50 seconds, 7 METs, 89% THR, LVEF 69%, no ischemia.  - she completed a Lexiscan 06/20/15 that showed no ischemia  - no significant chest pain. - compliant with meds   2. Hyperlipidemia - multiple statins caused leg pains, has not wanted to restart a statin.  - she stopped zetia. Reports felt fatigue, low energy.  - Jan 2020 TC 202 TG 105 HDL 64 LDL 117 - hestitant for repatha.    3. HTN - has not taken meds yet today.   4. Leg edema - compliant with prn lasix.    Past Medical History:  Diagnosis Date  . Abnormal cardiovascular function study     Cardiolite 2010 questionable lateral defect low risk ejection fraction 61%   . Acute GI bleeding    No recurrence and stable  . Cancer (HCC)    Skin  . Chronic neck pain   . Coronary artery disease    Status post non-ST elevation microinfarction with bare-metal stent to circumflex. 2006. Non-ST elevation myocardial infarction with in-stent restenosis 2007 requiring drug-eluting stent., Lifelong Plavix  . Dyslipidemia    On statin drug therapy  . GERD (gastroesophageal reflux disease)     on H2 blocker  . Hypertension   . Postmenopausal bleeding    history     Allergies  Allergen Reactions  . Penicillins Swelling and Rash    REACTION: unspecified     Current Outpatient Medications  Medication Sig Dispense Refill  . ALPRAZolam (XANAX) 0.5 MG tablet Take 0.5 mg by mouth as needed.     Marland Kitchen aspirin 81 MG tablet Take 81 mg by mouth daily.    . clopidogrel (PLAVIX) 75 MG tablet Take 1 tablet (75 mg total) by mouth daily. 90 tablet 3  . furosemide (LASIX) 20 MG tablet Take one tab daily as needed 90 tablet 3  .  lisinopril-hydrochlorothiazide (PRINZIDE,ZESTORETIC) 20-25 MG per tablet Take 1 tablet by mouth daily.    . metoprolol (LOPRESSOR) 50 MG tablet Take 1 tablet by mouth 2 (two) times daily.    . nitroGLYCERIN (NITROSTAT) 0.4 MG SL tablet Place under the tongue every 5 (five) minutes as needed.     No current facility-administered medications for this visit.      Past Surgical History:  Procedure Laterality Date  . BREAST SURGERY Right 10/15/11   lump removed  . CARDIAC CATHETERIZATION  10/06. 02/07   left heart, with bare metal stent  . CERVICAL POLYPECTOMY  06/13/03   endometrial  . CHOLECYSTECTOMY  1970  . DILATION AND CURETTAGE OF UTERUS    . SKIN CANCER EXCISION  2013     Allergies  Allergen Reactions  . Penicillins Swelling and Rash    REACTION: unspecified      Family History  Problem Relation Age of Onset  . Cancer Mother        stomach  . Hypertension Father   . Heart disease Father   . Diabetes Father   . Hypertension Brother   . Hypertension Son   . Heart disease Maternal Grandmother   . Early death Maternal Grandfather      Social History Ms. Vessel reports that  she has never smoked. She has never used smokeless tobacco. Ms. Grajeda reports no history of alcohol use.   Review of Systems CONSTITUTIONAL: No weight loss, fever, chills, weakness or fatigue.  HEENT: Eyes: No visual loss, blurred vision, double vision or yellow sclerae.No hearing loss, sneezing, congestion, runny nose or sore throat.  SKIN: No rash or itching.  CARDIOVASCULAR: per hpi RESPIRATORY: No shortness of breath, cough or sputum.  GASTROINTESTINAL: No anorexia, nausea, vomiting or diarrhea. No abdominal pain or blood.  GENITOURINARY: No burning on urination, no polyuria NEUROLOGICAL: No headache, dizziness, syncope, paralysis, ataxia, numbness or tingling in the extremities. No change in bowel or bladder control.  MUSCULOSKELETAL: No muscle, back pain, joint pain or stiffness.    LYMPHATICS: No enlarged nodes. No history of splenectomy.  PSYCHIATRIC: No history of depression or anxiety.  ENDOCRINOLOGIC: No reports of sweating, cold or heat intolerance. No polyuria or polydipsia.  Marland Kitchen   Physical Examination Vitals:   05/18/18 1105  BP: (!) 148/75  Pulse: 72  SpO2: 100%   Filed Weights   05/18/18 1105  Weight: 191 lb 12.8 oz (87 kg)    Gen: resting comfortably, no acute distress HEENT: no scleral icterus, pupils equal round and reactive, no palptable cervical adenopathy,  CV: RRR, no m/r/g, no jvd Resp: Clear to auscultation bilaterally GI: abdomen is soft, non-tender, non-distended, normal bowel sounds, no hepatosplenomegaly MSK: extremities are warm, no edema.  Skin: warm, no rash Neuro:  no focal deficits Psych: appropriate affect   Diagnostic Studies 06/2012 Exercise MPI Exercise MPI 06/2012: exercised 3 min 50 seconds, 7 METs, 89% THR, LVEF 69%, no ischemia.    06/2015 Lexiscan MPI  The study is normal.  This is a low risk study.  Nuclear stress EF: 71%.    Assessment and Plan  1. CAD - no symptoms continue current meds - EKG today shows NSR, no acute ischemic changes  2. HTN - elevated in clinic but has not taken meds yet today, continue to monitor  3. Hyperlipidemia - intolerant to statins and zetia. Hesitant to try repatha, she will think about it for now - continue dietary modification.         Arnoldo Lenis, M.D.

## 2018-05-18 NOTE — Patient Instructions (Addendum)

## 2018-07-07 ENCOUNTER — Other Ambulatory Visit: Payer: Self-pay | Admitting: *Deleted

## 2018-07-07 MED ORDER — CLOPIDOGREL BISULFATE 75 MG PO TABS: 75.0000 mg | ORAL_TABLET | Freq: Every day | ORAL | 3 refills | Status: DC

## 2019-04-12 ENCOUNTER — Ambulatory Visit (INDEPENDENT_AMBULATORY_CARE_PROVIDER_SITE_OTHER): Payer: Medicare HMO

## 2019-04-12 ENCOUNTER — Ambulatory Visit (INDEPENDENT_AMBULATORY_CARE_PROVIDER_SITE_OTHER): Payer: Medicare HMO | Admitting: Orthopaedic Surgery

## 2019-04-12 ENCOUNTER — Encounter: Payer: Self-pay | Admitting: Orthopaedic Surgery

## 2019-04-12 VITALS — BP 125/62 | HR 63 | Ht 61.0 in | Wt 185.0 lb

## 2019-04-12 DIAGNOSIS — M65971 Unspecified synovitis and tenosynovitis, right ankle and foot: Secondary | ICD-10-CM | POA: Insufficient documentation

## 2019-04-12 DIAGNOSIS — M25571 Pain in right ankle and joints of right foot: Secondary | ICD-10-CM

## 2019-04-12 DIAGNOSIS — M659 Synovitis and tenosynovitis, unspecified: Secondary | ICD-10-CM | POA: Insufficient documentation

## 2019-04-12 MED ORDER — BUPIVACAINE HCL 0.5 % IJ SOLN
1.0000 mL | INTRAMUSCULAR | Status: AC | PRN
  Administered 2019-04-12: 1 mL via INTRA_ARTICULAR

## 2019-04-12 MED ORDER — METHYLPREDNISOLONE ACETATE 40 MG/ML IJ SUSP
40.0000 mg | INTRAMUSCULAR | Status: AC | PRN
  Administered 2019-04-12: 40 mg via INTRA_ARTICULAR

## 2019-04-12 MED ORDER — LIDOCAINE HCL 1 % IJ SOLN
0.5000 mL | INTRAMUSCULAR | Status: AC | PRN
  Administered 2019-04-12: .5 mL

## 2019-04-12 NOTE — Progress Notes (Signed)
Office Visit Note   Patient: [name]           Date of Birth: 1935-11-03           MRN: [id number] Visit Date: 04/12/2019              Requested by: Christina Lair., MD [place],   [id number] PCP: Christina Lair., MD   Assessment & Plan: Visit Diagnoses:  1. Pain in right ankle and joints of right foot    Plan: Right ankle injection performed.  Post injection when she began weightbearing again she had good relief of her pain has been present with weightbearing prior to the injection.  I will check her back again in 6 weeks.  X-ray results were reviewed.  Follow-Up Instructions: No follow-ups on file.   Orders:  Orders Placed This Encounter  Procedures  . XR Ankle Complete Right   No orders of the defined types were placed in this encounter.     Procedures: Medium Joint Inj on 04/12/2019 3:48 PM Indications: pain Details: 25 G 1.5 in needle, anteromedial approach Medications: 0.5 mL lidocaine 1 %; 1 mL bupivacaine 0.5 %; 40 mg methylPREDNISolone acetate 40 MG/ML Outcome: tolerated well, no immediate complications Procedure, treatment alternatives, risks and benefits explained, specific risks discussed. Consent was given by the patient. Immediately prior to procedure a time out was called to verify the correct patient, procedure, equipment, support staff and site/side marked as required. Patient was prepped and draped in the usual sterile fashion.       Clinical Data: No additional findings.   Subjective: Chief Complaint  Patient presents with  . Right Ankle - Pain    HPI 83 year old female had ankle surgery by Dr. Gladstone White 25 years ago.  She had medial fixation as well as a lateral plate and had hardware removed except for a single anterior interfrag lag screw.  Patient recently has had some feeling like her ankle will give way she has had some pain in her ankle she is on it a lot.  If she sits she does not have pain if she is on her  feet a lot she has increased discomfort.  She denies any associated hip pain no chills or fever.  Both medial lateral incisions have been well-healed.  Patient does have history of cardiac disease and avoids anti-inflammatories per her cardiologist recommendations. She notes swelling on ankle if she is on it a lot. Review of Systems Is systems positive for history of GI bleeding, GERD, hyperlipidemia, coronary artery disease.  Previous ankle fracture chronic neck pain spondylosis.  Objective: Vital Signs: BP 125/62   Pulse 63   Ht 5\' 1"  (1.549 m)   Wt 185 lb (83.9 kg)   BMI 34.96 kg/m   Physical Exam Constitutional:      Appearance: She is well-developed.  HENT:     Head: Normocephalic.     Right Ear: External ear normal.     Left Ear: External ear normal.  Eyes:     Pupils: Pupils are equal, round, and reactive to light.  Neck:     Thyroid: No thyromegaly.     Trachea: No tracheal deviation.  Cardiovascular:     Rate and Rhythm: Normal rate.  Pulmonary:     Effort: Pulmonary effort is normal.  Abdominal:     Palpations: Abdomen is soft.  Skin:    General: Skin is warm and dry.  Neurological:  Mental Status: She is alert and oriented to person, place, and time.  Psychiatric:        Behavior: Behavior normal.     Ortho Exam healed medial lateral right ankle incisions no plantar foot lesions.  Good dorsiflexion plantarflexion strength.  EHL is active.  Some tenderness over the anterior medial ankle joint.  Deltoid ligament is normal negative anterior drawer.  No talar uncoverage.  Distal pulses are 2+.  Mild superficial venous changes. Specialty Comments:  No specialty comments available.  Imaging: No results found.   PMFS History: Patient Active Problem List   Diagnosis Date Noted  . Urge incontinence of urine 07/02/2014  . Urge urinary incontinence 05/04/2013  . Postmenopausal atrophic vaginitis 05/04/2013  . Preop cardiovascular exam 10/11/2011  . GERD  (gastroesophageal reflux disease)   . Abnormal cardiovascular function study   . Coronary artery disease   . Hypertension   . GI BLEEDING 06/09/2010  . CHEST PAIN 03/17/2009  . PRECORDIAL PAIN 03/17/2009  . DYSLIPIDEMIA 02/04/2009  . OBESITY 02/04/2009  . NECK PAIN, CHRONIC 02/04/2009   Past Medical History:  Diagnosis Date  . Abnormal cardiovascular function study     Cardiolite 2010 questionable lateral defect low risk ejection fraction 61%   . Acute GI bleeding    No recurrence and stable  . Cancer (HCC)    Skin  . Chronic neck pain   . Coronary artery disease    Status post non-ST elevation microinfarction with bare-metal stent to circumflex. 2006. Non-ST elevation myocardial infarction with in-stent restenosis 2007 requiring drug-eluting stent., Lifelong Plavix  . Dyslipidemia    On statin drug therapy  . GERD (gastroesophageal reflux disease)     on H2 blocker  . Hypertension   . Postmenopausal bleeding    history    Family History  Problem Relation Age of Onset  . Cancer Mother        stomach  . Hypertension Father   . Heart disease Father   . Diabetes Father   . Hypertension Brother   . Hypertension Son   . Heart disease Maternal Grandmother   . Early death Maternal Grandfather     Past Surgical History:  Procedure Laterality Date  . BREAST SURGERY Right 10/15/11   lump removed  . CARDIAC CATHETERIZATION  10/06. 02/07   left heart, with bare metal stent  . CERVICAL POLYPECTOMY  06/13/03   endometrial  . CHOLECYSTECTOMY  1970  . DILATION AND CURETTAGE OF UTERUS    . SKIN CANCER EXCISION  2013   Social History   Occupational History  . Not on file  Tobacco Use  . Smoking status: Never Smoker  . Smokeless tobacco: Never Used  Substance and Sexual Activity  . Alcohol use: No    Alcohol/week: 0.0 standard drinks  . Drug use: No  . Sexual activity: Not Currently    Birth control/protection: Post-menopausal

## 2019-04-19 ENCOUNTER — Telehealth: Payer: Self-pay | Admitting: Family Medicine

## 2019-04-19 ENCOUNTER — Other Ambulatory Visit: Payer: Self-pay

## 2019-04-19 ENCOUNTER — Encounter: Payer: Self-pay | Admitting: *Deleted

## 2019-04-19 ENCOUNTER — Ambulatory Visit (INDEPENDENT_AMBULATORY_CARE_PROVIDER_SITE_OTHER): Payer: Medicare HMO | Admitting: Family Medicine

## 2019-04-19 ENCOUNTER — Encounter: Payer: Self-pay | Admitting: Family Medicine

## 2019-04-19 VITALS — BP 140/80 | HR 68 | Ht 61.0 in | Wt 186.2 lb

## 2019-04-19 DIAGNOSIS — R079 Chest pain, unspecified: Secondary | ICD-10-CM

## 2019-04-19 DIAGNOSIS — I2510 Atherosclerotic heart disease of native coronary artery without angina pectoris: Secondary | ICD-10-CM

## 2019-04-19 DIAGNOSIS — E782 Mixed hyperlipidemia: Secondary | ICD-10-CM

## 2019-04-19 DIAGNOSIS — I10 Essential (primary) hypertension: Secondary | ICD-10-CM | POA: Diagnosis not present

## 2019-04-19 MED ORDER — NITROGLYCERIN 0.4 MG SL SUBL
0.4000 mg | SUBLINGUAL_TABLET | SUBLINGUAL | 3 refills | Status: DC | PRN
Start: 2019-04-19 — End: 2024-03-01

## 2019-04-19 NOTE — Telephone Encounter (Signed)
Pre-cert Verification for the following procedure    GXT scheduled for 04/26/2019 at [facility]

## 2019-04-19 NOTE — Patient Instructions (Signed)
Your physician recommends that you schedule a follow-up appointment in: [place]  Your physician recommends that you continue on your current medications as directed. Please refer to the Current Medication list given to you today.  Your physician has requested that you have an exercise tolerance test. For further information please visit [email or web address]. Please also follow instruction sheet, as given.  Thank you for choosing [facility]!!

## 2019-04-19 NOTE — Progress Notes (Signed)
Cardiology Office Note  Date: 04/19/2019   ID: [name], DOB 1935-05-22, MRN [id number]  PCP:  Sandi Mealy, MD  Cardiologist:  Carlyle Dolly, MD Electrophysiologist:  None   Chief Complaint  Patient presents with  . Follow-up    History of Present Illness: [name] is a 84 y.o. female last seen in follow-up May 18, 2018 with Dr. Harl Bowie.  History of coronary artery disease with prior STEMI in 2006, bare-metal stent to RCA.  In 2007 there was in-stent restenosis with another DES placed to RCA.  Current tenuous Plavix for lifelong therapy.  She has had 2 subsequent Lexiscan stress test that showed no evidence of ischemia in 2014 and again in 2017.  At last visit she denies any significant chest pain and was compliant with all of her medications.  She has intolerances to statin medication and stopped her Zetia.  January 2020 LDL was 117.  Patient was reticent to start injectable PCSK9 therapy.  Recent LDL at PCP office was 107 improved from January.  History of leg edema and was compliant with Lasix at last visit.  Patient states she has had some recent bilateral arm pain episodes x2 and one episode of chest pain x2 occurring in the evening without exertion prior to bedtime.  She does not report any exertional activity prior to the episodes.  She denied any associated nausea, vomiting, sweating, dizziness, palpitations.  States she walks 1 to 2 miles 2-3 times per week without any chest or arm pain.  She states the new episodes of chest pain and arm pain are concerning since she has had prior MI and stent placement.  Last nuclear stress test was in March 2017.  Patient states she is compliant with all medication therapy.  Medications reviewed with patient today.  States her sublingual nitroglycerin is probably no good since she has had it for some time.  States she has never had to use it.  She denies attempting to use it during these 3 recent episodes of  anginal-like symptoms.  Denies any exposure to Covid virus nor any symptoms.  Past Medical History:  Diagnosis Date  . Abnormal cardiovascular function study     Cardiolite 2010 questionable lateral defect low risk ejection fraction 61%   . Acute GI bleeding    No recurrence and stable  . Cancer (HCC)    Skin  . Chronic neck pain   . Coronary artery disease    Status post non-ST elevation microinfarction with bare-metal stent to circumflex. 2006. Non-ST elevation myocardial infarction with in-stent restenosis 2007 requiring drug-eluting stent., Lifelong Plavix  . Dyslipidemia    On statin drug therapy  . GERD (gastroesophageal reflux disease)     on H2 blocker  . Hypertension   . Postmenopausal bleeding    history    Past Surgical History:  Procedure Laterality Date  . BREAST SURGERY Right 10/15/11   lump removed  . CARDIAC CATHETERIZATION  10/06. 02/07   left heart, with bare metal stent  . CERVICAL POLYPECTOMY  06/13/03   endometrial  . CHOLECYSTECTOMY  1970  . DILATION AND CURETTAGE OF UTERUS    . SKIN CANCER EXCISION  2013    Current Outpatient Medications  Medication Sig Dispense Refill  . ALPRAZolam (XANAX) 0.5 MG tablet Take 0.5 mg by mouth as needed.     Marland Kitchen aspirin 81 MG tablet Take 81 mg by mouth daily.    . clopidogrel (PLAVIX) 75 MG tablet Take 1  tablet (75 mg total) by mouth daily. 90 tablet 3  . furosemide (LASIX) 20 MG tablet Take one tab daily as needed 90 tablet 3  . lisinopril-hydrochlorothiazide (PRINZIDE,ZESTORETIC) 20-25 MG per tablet Take 1 tablet by mouth daily.    . metoprolol (LOPRESSOR) 50 MG tablet Take 1 tablet by mouth 2 (two) times daily.    . nitroGLYCERIN (NITROSTAT) 0.4 MG SL tablet Place under the tongue every 5 (five) minutes as needed.     No current facility-administered medications for this visit.   Allergies:  Penicillins   Social History: The patient  reports that she has never smoked. She has never used smokeless tobacco. She  reports that she does not drink alcohol or use drugs.   Family History: The patient's family history includes Cancer in her mother; Diabetes in her father; Early death in her maternal grandfather; Heart disease in her father and maternal grandmother; Hypertension in her brother, father, and son.   ROS:  Please see the history of present illness. Otherwise, complete review of systems is positive for none.  All other systems are reviewed and negative.   Physical Exam: VS:  BP 140/80   Pulse 68   Ht 5\' 1"  (1.549 m)   Wt 186 lb 3.2 oz (84.5 kg)   SpO2 99% Comment: on room air  BMI 35.18 kg/m , BMI Body mass index is 35.18 kg/m.  Wt Readings from Last 3 Encounters:  04/19/19 186 lb 3.2 oz (84.5 kg)  04/12/19 185 lb (83.9 kg)  05/18/18 191 lb 12.8 oz (87 kg)    General: Patient appears comfortable at rest. Neck: Supple, no elevated JVP or carotid bruits, no thyromegaly. Lungs: Clear to auscultation, nonlabored breathing at rest. Cardiac: Regular rate and rhythm, no S3 or significant systolic murmur, no pericardial rub. Extremities: No pitting edema, distal pulses 2+, bilateral venous varicosities left worse than right.. Skin: Warm and dry. Neuropsychiatric: Alert and oriented x3, affect grossly appropriate.  ECG:  An ECG dated May 18, 2018 was personally reviewed today and demonstrated:  Sinus rhythm 76  Recent Labwork:  Lab work April 25, 2018 at [facility] showed glucose of 102, BUN 32, creatinine 1.14, GFR 46, sodium 143, AST 16, ALT 14, hemoglobin 11.3, hematocrit 33.4, platelet 200, total cholesterol 202, triglycerides 105, HDL 64, LDL 117  Other Studies Reviewed Today:  06/2012 Exercise MPI Exercise MPI 06/2012: exercised 3 min 50 seconds, 7 METs, 89% THR, LVEF 69%, no ischemia.    06/2015 Lexiscan MPI  The study is normal.  This is a low risk study.  Nuclear stress EF: 71%.   Assessment and Plan:  1. Coronary artery disease involving native coronary  artery of native heart without angina pectoris   2. Mixed hyperlipidemia   3. Essential hypertension     1. Coronary artery disease involving native coronary artery of native heart without angina pectoris .  Previous STEMI in 2006 with bare-metal stent to RCA.  In-stent restenosis and DES to RCA in 2007.  Continue dual antiplatelet therapy with aspirin 81 mg and Plavix 75 mg.  Continue metoprolol 50 mg p.o. twice daily.  Reorder sublingual nitroglycerin.  Patient states hers is out of date.  2. Mixed hyperlipidemia Not on statin medication due to intolerance.  Patient had a recent lipid profile by PCP in September 2020 which showed total cholesterol 189, triglycerides 112, HDL 62, LDL 107 which was an improvement from an LDL of 117 in January 2020  3. Essential hypertension Blood pressure 140/80  today.  Continue lisinopril/hydrochlorothiazide 20/25 mg daily . 4. Chest pain, unspecified type Patient's had 3 recent episodes of anginal-like symptoms occurring at night without any precipitating factors such as exertion.  States on 2 episodes she had bilateral arm pain and a separate episode with chest pain.  She denied any radiation or associated symptoms such as nausea, vomiting, or diaphoresis.  States her last stress test was March 2017.  She is concerned this may be related to possible heart disease.  Get a nuclear treadmill stress test.   Medication Adjustments/Labs and Tests Ordered: Current medicines are reviewed at length with the patient today.  Concerns regarding medicines are outlined above.    There are no Patient Instructions on file for this visit.       Signed, Levell July, NP 04/19/2019 1:38 PM    [facility] at [place], [place], [place] [address] Phone: [phone]; Fax: [phone]

## 2019-04-24 ENCOUNTER — Telehealth: Payer: Self-pay | Admitting: *Deleted

## 2019-04-24 ENCOUNTER — Other Ambulatory Visit: Payer: Self-pay

## 2019-04-24 ENCOUNTER — Other Ambulatory Visit (HOSPITAL_COMMUNITY)
Admission: RE | Admit: 2019-04-24 | Discharge: 2019-04-24 | Disposition: A | Discharge status: Home / Self Care | Payer: Medicare HMO | Source: Ambulatory Visit | Attending: Family Medicine | Admitting: Family Medicine

## 2019-04-24 DIAGNOSIS — Z20822 Contact with and (suspected) exposure to covid-19: Secondary | ICD-10-CM | POA: Insufficient documentation

## 2019-04-24 DIAGNOSIS — Z01812 Encounter for preprocedural laboratory examination: Secondary | ICD-10-CM | POA: Diagnosis present

## 2019-04-24 LAB — SARS CORONAVIRUS 2 (TAT 6-24 HRS): SARS Coronavirus 2: NEGATIVE

## 2019-04-24 NOTE — Telephone Encounter (Signed)
LM to return call    Just sending you this staff message so we will have documentation regarding Ms. [name] on the stress test evaluation. Mrs. [name] stated she did not want a nuclear stress study per staff. She would consent to a routine treadmill stress test. However this would not provide enough information for decision making on whether to proceed to more invasive tests. We discussed this in the office during her visit and she consented to a nuclear stress study at that time. Since she does have pre-existing coronary artery disease with stent placement, a routine treadmill stress test would not provide any new information for her complaints of chest pain/chest pressure. Thanks, Jonni Sanger

## 2019-04-24 NOTE — Telephone Encounter (Signed)
Pt voiced understanding and declines to have nuclear stress test at this time - will cx GXT and made schedulers aware

## 2019-04-26 ENCOUNTER — Encounter (HOSPITAL_COMMUNITY): Payer: Medicare HMO

## 2019-05-24 ENCOUNTER — Ambulatory Visit: Payer: Medicare HMO | Admitting: Orthopaedic Surgery

## 2019-07-05 ENCOUNTER — Ambulatory Visit (INDEPENDENT_AMBULATORY_CARE_PROVIDER_SITE_OTHER): Payer: Medicare HMO | Admitting: Orthopaedic Surgery

## 2019-07-05 ENCOUNTER — Encounter: Payer: Self-pay | Admitting: Orthopaedic Surgery

## 2019-07-05 VITALS — BP 118/61 | HR 68 | Ht 60.0 in | Wt 180.0 lb

## 2019-07-05 DIAGNOSIS — M659 Synovitis and tenosynovitis, unspecified: Secondary | ICD-10-CM | POA: Diagnosis not present

## 2019-07-05 DIAGNOSIS — M65971 Unspecified synovitis and tenosynovitis, right ankle and foot: Secondary | ICD-10-CM

## 2019-07-05 NOTE — Progress Notes (Signed)
Office Visit Note   Patient: [name]           Date of Birth: May 27, 1935           MRN: [id number] Visit Date: 07/05/2019              Requested by: Leeanne Rio, MD [place],  [place] [address] PCP: Leeanne Rio, MD   Assessment & Plan: Visit Diagnoses:  1. Synovitis of right ankle     Plan: Patient is better after resting elevation.  No injection needed at this point she will return if she has increased problems we discussed the posttraumatic arthritis present.  If she has persistent problems and injections not effective then we could consider diagnostic MRI scan.  Currently she is walking well return if she has recurrent problems.  Follow-Up Instructions: No follow-ups on file.   Orders:  No orders of the defined types were placed in this encounter.  No orders of the defined types were placed in this encounter.     Procedures: No procedures performed   Clinical Data: No additional findings.   Subjective: Chief Complaint  Patient presents with  . Right Ankle - Pain    HPI 84 year old female returns with past history of ankle synovitis posttraumatic arthritis.  She had hardware removed and previous injection in December gave her great relief until about a week ago when she was up on her feet for 3 days doing a lot of work at a fast pace and started having increased pain to the point where she could walk she stayed off her foot rested it use elevation ice and states now it is better she is walking well and not limping.  She is wondering if she should get another injection today.  Review of Systems 14 point system update unchanged from 04/12/2019.   Objective: Vital Signs: BP 118/61   Pulse 68   Ht 5' (1.524 m)   Wt 180 lb (81.6 kg)   BMI 35.15 kg/m   Physical Exam Constitutional:      Appearance: She is well-developed.  HENT:     Head: Normocephalic.     Right Ear: External ear normal.     Left Ear: External ear normal.    Eyes:     Pupils: Pupils are equal, round, and reactive to light.  Neck:     Thyroid: No thyromegaly.     Trachea: No tracheal deviation.  Cardiovascular:     Rate and Rhythm: Normal rate.  Pulmonary:     Effort: Pulmonary effort is normal.  Abdominal:     Palpations: Abdomen is soft.  Skin:    General: Skin is warm and dry.  Neurological:     Mental Status: She is alert and oriented to person, place, and time.  Psychiatric:        Behavior: Behavior normal.     Ortho Exam patient is ambulatory without limping.  No significant swelling of the ankle. Specialty Comments:  No specialty comments available.  Imaging: No results found.   PMFS History: Patient Active Problem List   Diagnosis Date Noted  . Synovitis of right ankle 04/12/2019  . Urge incontinence of urine 07/02/2014  . Urge urinary incontinence 05/04/2013  . Postmenopausal atrophic vaginitis 05/04/2013  . Preop cardiovascular exam 10/11/2011  . GERD (gastroesophageal reflux disease)   . Abnormal cardiovascular function study   . Coronary artery disease   . Hypertension   . GI BLEEDING 06/09/2010  .  CHEST PAIN 03/17/2009  . PRECORDIAL PAIN 03/17/2009  . DYSLIPIDEMIA 02/04/2009  . OBESITY 02/04/2009  . NECK PAIN, CHRONIC 02/04/2009   Past Medical History:  Diagnosis Date  . Abnormal cardiovascular function study     Cardiolite 2010 questionable lateral defect low risk ejection fraction 61%   . Acute GI bleeding    No recurrence and stable  . Cancer (HCC)    Skin  . Chronic neck pain   . Coronary artery disease    Status post non-ST elevation microinfarction with bare-metal stent to circumflex. 2006. Non-ST elevation myocardial infarction with in-stent restenosis 2007 requiring drug-eluting stent., Lifelong Plavix  . Dyslipidemia    On statin drug therapy  . GERD (gastroesophageal reflux disease)     on H2 blocker  . Hypertension   . Postmenopausal bleeding    history    Family History   Problem Relation Age of Onset  . Cancer Mother        stomach  . Hypertension Father   . Heart disease Father   . Diabetes Father   . Hypertension Brother   . Hypertension Son   . Heart disease Maternal Grandmother   . Early death Maternal Grandfather     Past Surgical History:  Procedure Laterality Date  . BREAST SURGERY Right 10/15/11   lump removed  . CARDIAC CATHETERIZATION  10/06. 02/07   left heart, with bare metal stent  . CERVICAL POLYPECTOMY  06/13/03   endometrial  . CHOLECYSTECTOMY  1970  . DILATION AND CURETTAGE OF UTERUS    . SKIN CANCER EXCISION  2013   Social History   Occupational History  . Not on file  Tobacco Use  . Smoking status: Never Smoker  . Smokeless tobacco: Never Used  Substance and Sexual Activity  . Alcohol use: No    Alcohol/week: 0.0 standard drinks  . Drug use: No  . Sexual activity: Not Currently    Birth control/protection: Post-menopausal

## 2019-07-27 ENCOUNTER — Ambulatory Visit: Payer: Medicare HMO | Admitting: Cardiology

## 2019-08-06 ENCOUNTER — Other Ambulatory Visit: Payer: Self-pay | Admitting: Cardiology

## 2019-09-27 ENCOUNTER — Ambulatory Visit (INDEPENDENT_AMBULATORY_CARE_PROVIDER_SITE_OTHER): Payer: Medicare HMO | Admitting: Orthopaedic Surgery

## 2019-09-27 ENCOUNTER — Encounter: Payer: Self-pay | Admitting: Orthopaedic Surgery

## 2019-09-27 ENCOUNTER — Other Ambulatory Visit: Payer: Self-pay

## 2019-09-27 VITALS — BP 127/42 | HR 64 | Ht 60.0 in | Wt 180.0 lb

## 2019-09-27 DIAGNOSIS — M65971 Unspecified synovitis and tenosynovitis, right ankle and foot: Secondary | ICD-10-CM

## 2019-09-27 DIAGNOSIS — M659 Synovitis and tenosynovitis, unspecified: Secondary | ICD-10-CM | POA: Diagnosis not present

## 2019-09-27 MED ORDER — LIDOCAINE HCL (PF) 1 % IJ SOLN
2.0000 mL | INTRAMUSCULAR | Status: AC | PRN
  Administered 2019-09-27: 2 mL

## 2019-09-27 MED ORDER — LIDOCAINE HCL 1 % IJ SOLN
0.5000 mL | INTRAMUSCULAR | Status: AC | PRN
  Administered 2019-09-27: .5 mL

## 2019-09-27 MED ORDER — METHYLPREDNISOLONE ACETATE 40 MG/ML IJ SUSP
40.0000 mg | INTRAMUSCULAR | Status: AC | PRN
  Administered 2019-09-27: 40 mg via INTRA_ARTICULAR

## 2019-09-27 NOTE — Progress Notes (Signed)
Office Visit Note   Patient: [name]           Date of Birth: October 29, 1935           MRN: [id number] Visit Date: 09/27/2019              Requested by: Leeanne Rio, MD [place],  [place] [address] PCP: Leeanne Rio, MD   Assessment & Plan: Visit Diagnoses:  1. Synovitis of right ankle     Plan: Ankle injection performed which she tolerated well.  Follow-up as needed.  Follow-Up Instructions: No follow-ups on file.   Orders:  No orders of the defined types were placed in this encounter.  No orders of the defined types were placed in this encounter.     Procedures: Medium Joint Inj: R ankle on 09/27/2019 2:28 PM Medications: 0.5 mL lidocaine 1 %; 2 mL lidocaine (PF) 1 %; 40 mg methylPREDNISolone acetate 40 MG/ML      Clinical Data: No additional findings.   Subjective: Chief Complaint  Patient presents with  . Right Ankle - Pain    HPI 84 year old female returns with posttraumatic arthritis in her ankle from old fracture 1995 with recurrent synovitis ankle pain and is requesting repeat injection.  Previous injection last year gave her good relief for several months.  Review of Systems all systems are noncontributory to HPI.   Objective: Vital Signs: BP (!) 127/42   Pulse 64   Ht 5' (1.524 m)   Wt 180 lb (81.6 kg)   BMI 35.15 kg/m   Physical Exam Constitutional:      Appearance: She is well-developed.  HENT:     Head: Normocephalic.     Right Ear: External ear normal.     Left Ear: External ear normal.  Eyes:     Pupils: Pupils are equal, round, and reactive to light.  Neck:     Thyroid: No thyromegaly.     Trachea: No tracheal deviation.  Cardiovascular:     Rate and Rhythm: Normal rate.  Pulmonary:     Effort: Pulmonary effort is normal.  Abdominal:     Palpations: Abdomen is soft.  Skin:    General: Skin is warm and dry.  Neurological:     Mental Status: She is alert and oriented to person, place, and time.    Psychiatric:        Behavior: Behavior normal.     Ortho Exam patient has trace valgus of her ankle tenderness across the anterior joint consistent with her synovitis.  Some superficial lobe vein prominence.  Healed lateral incision.  Good capillary refill distally.  Specialty Comments:  No specialty comments available.  Imaging: No results found.   PMFS History: Patient Active Problem List   Diagnosis Date Noted  . Synovitis of right ankle 04/12/2019  . Urge incontinence of urine 07/02/2014  . Urge urinary incontinence 05/04/2013  . Postmenopausal atrophic vaginitis 05/04/2013  . Preop cardiovascular exam 10/11/2011  . GERD (gastroesophageal reflux disease)   . Abnormal cardiovascular function study   . Coronary artery disease   . Hypertension   . GI BLEEDING 06/09/2010  . CHEST PAIN 03/17/2009  . PRECORDIAL PAIN 03/17/2009  . DYSLIPIDEMIA 02/04/2009  . OBESITY 02/04/2009  . NECK PAIN, CHRONIC 02/04/2009   Past Medical History:  Diagnosis Date  . Abnormal cardiovascular function study     Cardiolite 2010 questionable lateral defect low risk ejection fraction 61%   . Acute GI bleeding  No recurrence and stable  . Cancer (HCC)    Skin  . Chronic neck pain   . Coronary artery disease    Status post non-ST elevation microinfarction with bare-metal stent to circumflex. 2006. Non-ST elevation myocardial infarction with in-stent restenosis 2007 requiring drug-eluting stent., Lifelong Plavix  . Dyslipidemia    On statin drug therapy  . GERD (gastroesophageal reflux disease)     on H2 blocker  . Hypertension   . Postmenopausal bleeding    history    Family History  Problem Relation Age of Onset  . Cancer Mother        stomach  . Hypertension Father   . Heart disease Father   . Diabetes Father   . Hypertension Brother   . Hypertension Son   . Heart disease Maternal Grandmother   . Early death Maternal Grandfather     Past Surgical History:  Procedure  Laterality Date  . BREAST SURGERY Right 10/15/11   lump removed  . CARDIAC CATHETERIZATION  10/06. 02/07   left heart, with bare metal stent  . CERVICAL POLYPECTOMY  06/13/03   endometrial  . CHOLECYSTECTOMY  1970  . DILATION AND CURETTAGE OF UTERUS    . SKIN CANCER EXCISION  2013   Social History   Occupational History  . Not on file  Tobacco Use  . Smoking status: Never Smoker  . Smokeless tobacco: Never Used  Substance and Sexual Activity  . Alcohol use: No    Alcohol/week: 0.0 standard drinks  . Drug use: No  . Sexual activity: Not Currently    Birth control/protection: Post-menopausal

## 2019-11-07 ENCOUNTER — Other Ambulatory Visit: Payer: Self-pay | Admitting: Cardiology

## 2020-02-21 ENCOUNTER — Ambulatory Visit (INDEPENDENT_AMBULATORY_CARE_PROVIDER_SITE_OTHER): Payer: Medicare HMO | Admitting: Orthopaedic Surgery

## 2020-02-21 ENCOUNTER — Other Ambulatory Visit: Payer: Self-pay

## 2020-02-21 ENCOUNTER — Encounter: Payer: Self-pay | Admitting: Orthopaedic Surgery

## 2020-02-21 VITALS — Ht 61.5 in | Wt 179.0 lb

## 2020-02-21 DIAGNOSIS — M65971 Unspecified synovitis and tenosynovitis, right ankle and foot: Secondary | ICD-10-CM

## 2020-02-21 DIAGNOSIS — M659 Synovitis and tenosynovitis, unspecified: Secondary | ICD-10-CM

## 2020-02-21 DIAGNOSIS — M1A0710 Idiopathic chronic gout, right ankle and foot, without tophus (tophi): Secondary | ICD-10-CM | POA: Diagnosis not present

## 2020-02-21 DIAGNOSIS — M1A00X0 Idiopathic chronic gout, unspecified site, without tophus (tophi): Secondary | ICD-10-CM | POA: Insufficient documentation

## 2020-02-21 MED ORDER — LIDOCAINE HCL 1 % IJ SOLN
0.5000 mL | INTRAMUSCULAR | Status: AC | PRN
  Administered 2020-02-21: .5 mL

## 2020-02-21 MED ORDER — METHYLPREDNISOLONE ACETATE 40 MG/ML IJ SUSP
40.0000 mg | INTRAMUSCULAR | Status: AC | PRN
  Administered 2020-02-21: 40 mg via INTRA_ARTICULAR

## 2020-02-21 MED ORDER — BUPIVACAINE HCL 0.5 % IJ SOLN
1.0000 mL | INTRAMUSCULAR | Status: AC | PRN
  Administered 2020-02-21: 1 mL via INTRA_ARTICULAR

## 2020-02-21 MED ORDER — PREDNISONE 5 MG PO TABS: ORAL_TABLET | ORAL | 0 refills | Status: DC

## 2020-02-21 NOTE — Progress Notes (Signed)
Office Visit Note   Patient: [name]           Date of Birth: 24-Jul-1935           MRN: [id number] Visit Date: 02/21/2020              Requested by: Leeanne Rio, MD [place],  [place] [address] PCP: Leeanne Rio, MD   Assessment & Plan: Visit Diagnoses:  1. Synovitis of right ankle   2. Chronic idiopathic gout involving toe of right foot without tophus     Plan: Prednisone 5 mg Dosepak sent in for her likely recurrent gout great toe with this being her second attack.  Ankle injection performed which she tolerated well.  If she has another attack then repeat renal test and serum uric acid would be recommended and possible consideration of chronic antigout treatment such as allopurinol.  Follow-Up Instructions: No follow-ups on file.   Orders:  Orders Placed This Encounter  Procedures  . Medium Joint Inj   Meds ordered this encounter  Medications  . predniSONE (DELTASONE) 5 MG tablet    Sig: Take as instructed,6,5,4,3,2,1    Dispense:  21 tablet    Refill:  0      Procedures: Medium Joint Inj: R ankle on 02/21/2020 1:28 PM Indications: pain Details: 22 G 1.5 in needle, anterolateral approach Medications: 0.5 mL lidocaine 1 %; 1 mL bupivacaine 0.5 %; 40 mg methylPREDNISolone acetate 40 MG/ML Outcome: tolerated well, no immediate complications Procedure, treatment alternatives, risks and benefits explained, specific risks discussed. Consent was given by the patient. Immediately prior to procedure a time out was called to verify the correct patient, procedure, equipment, support staff and site/side marked as required. Patient was prepped and draped in the usual sterile fashion.       Clinical Data: No additional findings.   Subjective: Chief Complaint  Patient presents with  . Right Ankle - Pain  . Right Foot - Pain    HPI 84 year old female returns for recurrent problems with right ankle pain where she has osteoarthritis and also  recently she has had pain in her right great toe throbbing red painful and she states this is her second time she has gotten gout.  Previously she took a steroid taper with good relief.  She denies any other joint symptoms.  Review of Systems update unchanged from last visit 09/27/2019 other than as mentioned above.   Objective: Vital Signs: Ht 5' 1.5" (1.562 m)   Wt 179 lb (81.2 kg)   BMI 33.27 kg/m   Physical Exam Constitutional:      Appearance: She is well-developed.  HENT:     Head: Normocephalic.     Right Ear: External ear normal.     Left Ear: External ear normal.  Eyes:     Pupils: Pupils are equal, round, and reactive to light.  Neck:     Thyroid: No thyromegaly.     Trachea: No tracheal deviation.  Cardiovascular:     Rate and Rhythm: Normal rate.  Pulmonary:     Effort: Pulmonary effort is normal.  Abdominal:     Palpations: Abdomen is soft.  Skin:    General: Skin is warm and dry.  Neurological:     Mental Status: She is alert and oriented to person, place, and time.  Psychiatric:        Behavior: Behavior normal.     Ortho Exam right first metatarsal phalangeal joint is  erythematous warm swollen painful with range of motion.  Good capillary refill to the toe.  She has 50% ankle dorsiflexion plantarflexion with tenderness over the anterior ankle joint.  Superficial venous prominence without skin ulceration present over the ankle and foot.  Specialty Comments:  No specialty comments available.  Imaging: No results found.   PMFS History: Patient Active Problem List   Diagnosis Date Noted  . Idiopathic chronic gout, unspecified site, without tophus (tophi) 02/21/2020  . Synovitis of right ankle 04/12/2019  . Urge incontinence of urine 07/02/2014  . Urge urinary incontinence 05/04/2013  . Postmenopausal atrophic vaginitis 05/04/2013  . Preop cardiovascular exam 10/11/2011  . GERD (gastroesophageal reflux disease)   . Abnormal cardiovascular function  study   . Coronary artery disease   . Hypertension   . GI BLEEDING 06/09/2010  . CHEST PAIN 03/17/2009  . PRECORDIAL PAIN 03/17/2009  . DYSLIPIDEMIA 02/04/2009  . OBESITY 02/04/2009  . NECK PAIN, CHRONIC 02/04/2009   Past Medical History:  Diagnosis Date  . Abnormal cardiovascular function study     Cardiolite 2010 questionable lateral defect low risk ejection fraction 61%   . Acute GI bleeding    No recurrence and stable  . Cancer (HCC)    Skin  . Chronic neck pain   . Coronary artery disease    Status post non-ST elevation microinfarction with bare-metal stent to circumflex. 2006. Non-ST elevation myocardial infarction with in-stent restenosis 2007 requiring drug-eluting stent., Lifelong Plavix  . Dyslipidemia    On statin drug therapy  . GERD (gastroesophageal reflux disease)     on H2 blocker  . Hypertension   . Postmenopausal bleeding    history    Family History  Problem Relation Age of Onset  . Cancer Mother        stomach  . Hypertension Father   . Heart disease Father   . Diabetes Father   . Hypertension Brother   . Hypertension Son   . Heart disease Maternal Grandmother   . Early death Maternal Grandfather     Past Surgical History:  Procedure Laterality Date  . BREAST SURGERY Right 10/15/11   lump removed  . CARDIAC CATHETERIZATION  10/06. 02/07   left heart, with bare metal stent  . CERVICAL POLYPECTOMY  06/13/03   endometrial  . CHOLECYSTECTOMY  1970  . DILATION AND CURETTAGE OF UTERUS    . SKIN CANCER EXCISION  2013   Social History   Occupational History  . Not on file  Tobacco Use  . Smoking status: Never Smoker  . Smokeless tobacco: Never Used  Substance and Sexual Activity  . Alcohol use: No    Alcohol/week: 0.0 standard drinks  . Drug use: No  . Sexual activity: Not Currently    Birth control/protection: Post-menopausal

## 2020-05-01 DIAGNOSIS — R252 Cramp and spasm: Secondary | ICD-10-CM | POA: Diagnosis not present

## 2020-05-15 ENCOUNTER — Other Ambulatory Visit: Payer: Self-pay | Admitting: Cardiology

## 2020-05-28 NOTE — Progress Notes (Signed)
Cardiology Office Note  Date: 05/29/2020   ID: [name], DOB Dec 19, 1935, MRN [id number]  PCP:  Christina Rio, MD  Cardiologist:  Christina Dolly, MD Electrophysiologist:  None   1 year cardiac follow up  History of Present Illness: [name] is a 85 y.o. female last seen in follow-up May 18, 2018 with Dr. Harl Bowie.  History of coronary artery disease with prior STEMI in 2006, bare-metal stent to RCA.  In 2007 there was in-stent restenosis with another DES placed to RCA.  Continues Plavix for lifelong therapy.  She has had 2 subsequent Lexiscan stress tests with no evidence of ischemia in 2014 and again in 2017.  Patient is here for 1 year follow-up.  She denies any recent acute illnesses or hospitalizations.  States she has been having some issues with lower ankle pain, gout-like pain in her toes.  She has a history of gout.  She states she recently saw her primary care provider who stated her uric acid levels were elevated.  She states he has gained some weight and finding it hard to lose.  She does not take colchicine or allopurinol due to the side effects.  She has had a previous ankle fracture in her right lower ankle.  She does have some superficial varicosities in her ankles and feet.  She denies any anginal or exertional symptoms.  She states she is very active and is participating in a corn hole tournament and participates in line dancing to stay active.  She denies any palpitations or arrhythmias, orthostatic symptoms.  Denies any PND, orthopnea, bleeding symptoms.  Denies any typical claudication symptoms.  No DVT or PE-like symptoms, or lower extremity edema.  She does states she has some superficial varicosities in her upper leg.  Denies any venous claudication or venous stasis changes.   Past Medical History:  Diagnosis Date  . Abnormal cardiovascular function study     Cardiolite 2010 questionable lateral defect low risk ejection fraction 61%   . Acute GI  bleeding    No recurrence and stable  . Cancer (HCC)    Skin  . Chronic neck pain   . Coronary artery disease    Status post non-ST elevation microinfarction with bare-metal stent to circumflex. 2006. Non-ST elevation myocardial infarction with in-stent restenosis 2007 requiring drug-eluting stent., Lifelong Plavix  . Dyslipidemia    On statin drug therapy  . GERD (gastroesophageal reflux disease)     on H2 blocker  . Hypertension   . Postmenopausal bleeding    history    Past Surgical History:  Procedure Laterality Date  . BREAST SURGERY Right 10/15/11   lump removed  . CARDIAC CATHETERIZATION  10/06. 02/07   left heart, with bare metal stent  . CERVICAL POLYPECTOMY  06/13/03   endometrial  . CHOLECYSTECTOMY  1970  . DILATION AND CURETTAGE OF UTERUS    . SKIN CANCER EXCISION  2013    Current Outpatient Medications  Medication Sig Dispense Refill  . ALPRAZolam (XANAX) 0.5 MG tablet Take 0.5 mg by mouth as needed.     Marland Kitchen aspirin 81 MG tablet Take 81 mg by mouth daily.    . clopidogrel (PLAVIX) 75 MG tablet Take 1 tablet by mouth once daily 15 tablet 0  . furosemide (LASIX) 20 MG tablet Take one tab daily as needed 90 tablet 3  . lisinopril-hydrochlorothiazide (PRINZIDE,ZESTORETIC) 20-25 MG per tablet Take 1 tablet by mouth daily.    . metoprolol (LOPRESSOR) 50 MG tablet  Take 1 tablet by mouth 2 (two) times daily.    . nitroGLYCERIN (NITROSTAT) 0.4 MG SL tablet Place 1 tablet (0.4 mg total) under the tongue every 5 (five) minutes as needed. 25 tablet 3   No current facility-administered medications for this visit.   Allergies:  Penicillins   Social History: The patient  reports that she has never smoked. She has never used smokeless tobacco. She reports that she does not drink alcohol and does not use drugs.   Family History: The patient's family history includes Cancer in her mother; Diabetes in her father; Early death in her maternal grandfather; Heart disease in her father  and maternal grandmother; Hypertension in her brother, father, and son.   ROS:  Please see the history of present illness. Otherwise, complete review of systems is positive for none.  All other systems are reviewed and negative.   Physical Exam: VS:  BP 118/66   Pulse 66   Ht 5\' 1"  (1.549 m)   Wt 185 lb (83.9 kg)   SpO2 97%   BMI 34.96 kg/m , BMI Body mass index is 34.96 kg/m.  Wt Readings from Last 3 Encounters:  05/29/20 185 lb (83.9 kg)  02/21/20 179 lb (81.2 kg)  09/27/19 180 lb (81.6 kg)    General: Patient appears comfortable at rest. Neck: Supple, no elevated JVP or carotid bruits, no thyromegaly. Lungs: Clear to auscultation, nonlabored breathing at rest. Cardiac: Regular rate and rhythm, no S3 or significant systolic murmur, no pericardial rub. Extremities: No pitting edema, distal pulses 2+, bilateral venous varicosities left worse than right.. Skin: Warm and dry. Neuropsychiatric: Alert and oriented x3, affect grossly appropriate.  ECG:  An ECG dated May 18, 2018 was personally reviewed today and demonstrated:  Sinus rhythm 76  Recent Labwork:  Lab work April 25, 2018 at [facility] showed glucose of 102, BUN 32, creatinine 1.14, GFR 46, sodium 143, AST 16, ALT 14, hemoglobin 11.3, hematocrit 33.4, platelet 200, total cholesterol 202, triglycerides 105, HDL 64, LDL 117  Other Studies Reviewed Today:  06/2012 Exercise MPI Exercise MPI 06/2012: exercised 3 min 50 seconds, 7 METs, 89% THR, LVEF 69%, no ischemia.    06/2015 Lexiscan MPI  The study is normal.  This is a low risk study.  Nuclear stress EF: 71%.   Assessment and Plan:  1. CAD in native artery   2. Mixed hyperlipidemia   3. Essential hypertension   4. Chest pain, unspecified type     1. Coronary artery disease involving native coronary artery of native heart without angina pectoris  Denies any current anginal or exertion symptoms.  Previous STEMI in 2006 with bare-metal stent to  RCA.  In-stent restenosis and DES to RCA in 2007.  Continue dual antiplatelet therapy with aspirin 81 mg and Plavix 75 mg.  Continue metoprolol 50 mg p.o. twice daily.  Continue sublingual nitroglycerin.  She needs a refill on her Plavix.  Please refill Plavix 75 mg p.o. daily   2. Mixed hyperlipidemia Not on statin medication due to intolerance.  States she had recent lab work at PCP office and cholesterol levels were elevated.  3. Essential hypertension Blood pressure well controlled on current therapy today.  Blood pressure today 118/66.  Continue lisinopril/hydrochlorothiazide 20/25 mg daily . 4. Chest pain, unspecified type Currently denies any anginal or exertional symptoms.  At last visit we ordered a Lexiscan stress test for complaints of chest pain.  Eventually she refused stating she did not want any test that  involved a needle.   Medication Adjustments/Labs and Tests Ordered: Current medicines are reviewed at length with the patient today.  Concerns regarding medicines are outlined above.    There are no Patient Instructions on file for this visit.       Signed, Levell July, NP 05/29/2020 2:47 PM    [facility] [place] [place], [place], [place] [address] Phone: [phone]; Fax: [phone]

## 2020-05-29 ENCOUNTER — Ambulatory Visit: Payer: Medicare HMO | Admitting: Family Medicine

## 2020-05-29 ENCOUNTER — Encounter: Payer: Self-pay | Admitting: Family Medicine

## 2020-05-29 VITALS — BP 118/66 | HR 66 | Ht 61.0 in | Wt 185.0 lb

## 2020-05-29 DIAGNOSIS — I10 Essential (primary) hypertension: Secondary | ICD-10-CM

## 2020-05-29 DIAGNOSIS — E782 Mixed hyperlipidemia: Secondary | ICD-10-CM | POA: Diagnosis not present

## 2020-05-29 DIAGNOSIS — I2510 Atherosclerotic heart disease of native coronary artery without angina pectoris: Secondary | ICD-10-CM | POA: Diagnosis not present

## 2020-05-29 DIAGNOSIS — R079 Chest pain, unspecified: Secondary | ICD-10-CM

## 2020-05-29 MED ORDER — CLOPIDOGREL BISULFATE 75 MG PO TABS: 75.0000 mg | ORAL_TABLET | Freq: Every day | ORAL | 3 refills | Status: DC

## 2020-05-29 NOTE — Patient Instructions (Signed)

## 2020-05-30 ENCOUNTER — Encounter: Payer: Self-pay | Admitting: Family Medicine

## 2020-05-30 NOTE — Progress Notes (Signed)
Thanks, I will fix that

## 2020-06-24 DIAGNOSIS — L570 Actinic keratosis: Secondary | ICD-10-CM | POA: Diagnosis not present

## 2020-06-24 DIAGNOSIS — C44329 Squamous cell carcinoma of skin of other parts of face: Secondary | ICD-10-CM | POA: Diagnosis not present

## 2021-01-15 ENCOUNTER — Encounter: Payer: Self-pay | Admitting: Orthopaedic Surgery

## 2021-01-15 ENCOUNTER — Ambulatory Visit (INDEPENDENT_AMBULATORY_CARE_PROVIDER_SITE_OTHER): Payer: Medicare HMO | Admitting: Orthopaedic Surgery

## 2021-01-15 ENCOUNTER — Other Ambulatory Visit: Payer: Self-pay

## 2021-01-15 VITALS — Ht 61.5 in | Wt 182.0 lb

## 2021-01-15 DIAGNOSIS — M659 Synovitis and tenosynovitis, unspecified: Secondary | ICD-10-CM

## 2021-01-15 DIAGNOSIS — M65971 Unspecified synovitis and tenosynovitis, right ankle and foot: Secondary | ICD-10-CM

## 2021-01-15 DIAGNOSIS — M19171 Post-traumatic osteoarthritis, right ankle and foot: Secondary | ICD-10-CM | POA: Insufficient documentation

## 2021-01-15 MED ORDER — METHYLPREDNISOLONE ACETATE 40 MG/ML IJ SUSP
40.0000 mg | INTRAMUSCULAR | Status: AC | PRN
  Administered 2021-01-15: 40 mg via INTRA_ARTICULAR

## 2021-01-15 MED ORDER — BUPIVACAINE HCL 0.5 % IJ SOLN
3.0000 mL | INTRAMUSCULAR | Status: AC | PRN
  Administered 2021-01-15: 3 mL via INTRA_ARTICULAR

## 2021-01-15 MED ORDER — LIDOCAINE HCL 1 % IJ SOLN
0.5000 mL | INTRAMUSCULAR | Status: AC | PRN
  Administered 2021-01-15: .5 mL

## 2021-01-15 NOTE — Progress Notes (Signed)
Office Visit Note   Patient: [name]           Date of Birth: January 07, 1936           MRN: [id number] Visit Date: 01/15/2021              Requested by: Leeanne Rio, MD [place],  [place] [id number] PCP: Leeanne Rio, MD   Assessment & Plan: Visit Diagnoses:  1. Synovitis of right ankle   2. Post-traumatic arthritis of ankle, right     Plan: Right ankle injection performed with good relief.  She will return if she has persistent symptoms.  Follow-Up Instructions: No follow-ups on file.   Orders:  No orders of the defined types were placed in this encounter.  No orders of the defined types were placed in this encounter.     Procedures: Medium Joint Inj: R ankle on 01/15/2021 10:07 AM Indications: pain Details: 25 G 1.5 in needle, anterolateral approach Medications: 3 mL bupivacaine 0.5 %; 0.5 mL lidocaine 1 %; 40 mg methylPREDNISolone acetate 40 MG/ML Outcome: tolerated well, no immediate complications Procedure, treatment alternatives, risks and benefits explained, specific risks discussed. Consent was given by the patient. Immediately prior to procedure a time out was called to verify the correct patient, procedure, equipment, support staff and site/side marked as required. Patient was prepped and draped in the usual sterile fashion.      Clinical Data: No additional findings.   Subjective: Chief Complaint  Patient presents with   Right Ankle - Pain    HPI 85 year old female with posttraumatic osteoarthritis mild valgus of the ankle after bimalleolar ankle fixation sometime in the 90s.  She still has interfrag screw present but the medial lateral hardware has been removed.  She has had recurrent synovitis problems previously got great relief with injection in November and now returns with increased problems again.  Does have history of gout but no pain first MTP joint.  She is not on allopurinol currently and the only joint that is been  bothering her is the right ankle which has some posttraumatic osteoarthritis and mild valgus.  Review of Systems patient does have a history of coronary artery disease no current chest pain.  All the systems noncontributory to HPI.  Positive history of gout.   Objective: Vital Signs: Ht 5' 1.5" (1.562 m)   Wt 182 lb (82.6 kg)   BMI 33.83 kg/m   Physical Exam Constitutional:      Appearance: She is well-developed.  HENT:     Head: Normocephalic.     Right Ear: External ear normal.     Left Ear: External ear normal. There is no impacted cerumen.  Eyes:     Pupils: Pupils are equal, round, and reactive to light.  Neck:     Thyroid: No thyromegaly.     Trachea: No tracheal deviation.  Cardiovascular:     Rate and Rhythm: Normal rate.  Pulmonary:     Effort: Pulmonary effort is normal.  Abdominal:     Palpations: Abdomen is soft.  Musculoskeletal:     Cervical back: No rigidity.  Skin:    General: Skin is warm and dry.  Neurological:     Mental Status: She is alert and oriented to person, place, and time.  Psychiatric:        Behavior: Behavior normal.    Ortho Exam mild synovitis right ankle.  Tenderness along the anterior capsule.  Well-healed medial and lateral  scars.  Medial scars long and vertical.  Posterior tibial and peroneal function is good plantar surface of her foot is normal.  Specialty Comments:  No specialty comments available.  Imaging: No results found.   PMFS History: Patient Active Problem List   Diagnosis Date Noted   Post-traumatic arthritis of ankle, right 01/15/2021   Idiopathic chronic gout, unspecified site, without tophus (tophi) 02/21/2020   Synovitis of right ankle 04/12/2019   Urge incontinence of urine 07/02/2014   Urge urinary incontinence 05/04/2013   Postmenopausal atrophic vaginitis 05/04/2013   Preop cardiovascular exam 10/11/2011   GERD (gastroesophageal reflux disease)    Abnormal cardiovascular function study    Coronary  artery disease    Hypertension    GI BLEEDING 06/09/2010   CHEST PAIN 03/17/2009   PRECORDIAL PAIN 03/17/2009   DYSLIPIDEMIA 02/04/2009   OBESITY 02/04/2009   NECK PAIN, CHRONIC 02/04/2009   Past Medical History:  Diagnosis Date   Abnormal cardiovascular function study     Cardiolite 2010 questionable lateral defect low risk ejection fraction 61%    Acute GI bleeding    No recurrence and stable   Cancer (HCC)    Skin   Chronic neck pain    Coronary artery disease    Status post non-ST elevation microinfarction with bare-metal stent to circumflex. 2006. Non-ST elevation myocardial infarction with in-stent restenosis 2007 requiring drug-eluting stent., Lifelong Plavix   Dyslipidemia    On statin drug therapy   GERD (gastroesophageal reflux disease)     on H2 blocker   Hypertension    Postmenopausal bleeding    history    Family History  Problem Relation Age of Onset   Cancer Mother        stomach   Hypertension Father    Heart disease Father    Diabetes Father    Hypertension Brother    Hypertension Son    Heart disease Maternal Grandmother    Early death Maternal Grandfather     Past Surgical History:  Procedure Laterality Date   BREAST SURGERY Right 10/15/11   lump removed   CARDIAC CATHETERIZATION  10/06. 02/07   left heart, with bare metal stent   CERVICAL POLYPECTOMY  06/13/03   endometrial   CHOLECYSTECTOMY  1970   DILATION AND CURETTAGE OF UTERUS     SKIN CANCER EXCISION  2013   Social History   Occupational History   Not on file  Tobacco Use   Smoking status: Never   Smokeless tobacco: Never  Substance and Sexual Activity   Alcohol use: No    Alcohol/week: 0.0 standard drinks   Drug use: No   Sexual activity: Not Currently    Birth control/protection: Post-menopausal

## 2021-02-11 DIAGNOSIS — H524 Presbyopia: Secondary | ICD-10-CM | POA: Diagnosis not present

## 2021-04-20 ENCOUNTER — Telehealth: Payer: Self-pay | Admitting: Cardiology

## 2021-04-20 NOTE — Telephone Encounter (Signed)
Left message to return call 

## 2021-04-20 NOTE — Telephone Encounter (Signed)
New Message:    Patient wants to know if she needs a Stress Test before her appointment in April to see Dr Harl Bowie?

## 2021-04-21 NOTE — Telephone Encounter (Signed)
Advised that it would be determined at her visit if she needed a stress test based on her symptoms and evaluation by the provider. Verbalized understanding

## 2021-04-21 NOTE — Telephone Encounter (Signed)
Patient returned call

## 2021-05-24 DIAGNOSIS — L02511 Cutaneous abscess of right hand: Secondary | ICD-10-CM | POA: Diagnosis not present

## 2021-05-27 DIAGNOSIS — Z85828 Personal history of other malignant neoplasm of skin: Secondary | ICD-10-CM | POA: Diagnosis not present

## 2021-05-27 DIAGNOSIS — Z20822 Contact with and (suspected) exposure to covid-19: Secondary | ICD-10-CM | POA: Diagnosis not present

## 2021-05-27 DIAGNOSIS — Z7982 Long term (current) use of aspirin: Secondary | ICD-10-CM | POA: Diagnosis not present

## 2021-05-27 DIAGNOSIS — I2510 Atherosclerotic heart disease of native coronary artery without angina pectoris: Secondary | ICD-10-CM | POA: Diagnosis not present

## 2021-05-27 DIAGNOSIS — Z7902 Long term (current) use of antithrombotics/antiplatelets: Secondary | ICD-10-CM | POA: Diagnosis not present

## 2021-05-27 DIAGNOSIS — I10 Essential (primary) hypertension: Secondary | ICD-10-CM | POA: Diagnosis not present

## 2021-05-27 DIAGNOSIS — I252 Old myocardial infarction: Secondary | ICD-10-CM | POA: Diagnosis not present

## 2021-05-27 DIAGNOSIS — L02511 Cutaneous abscess of right hand: Secondary | ICD-10-CM | POA: Diagnosis not present

## 2021-05-27 DIAGNOSIS — G8929 Other chronic pain: Secondary | ICD-10-CM | POA: Diagnosis not present

## 2021-05-27 DIAGNOSIS — M109 Gout, unspecified: Secondary | ICD-10-CM | POA: Diagnosis not present

## 2021-05-27 DIAGNOSIS — M1990 Unspecified osteoarthritis, unspecified site: Secondary | ICD-10-CM | POA: Diagnosis not present

## 2021-05-27 DIAGNOSIS — Z809 Family history of malignant neoplasm, unspecified: Secondary | ICD-10-CM | POA: Diagnosis not present

## 2021-05-27 DIAGNOSIS — Z8249 Family history of ischemic heart disease and other diseases of the circulatory system: Secondary | ICD-10-CM | POA: Diagnosis not present

## 2021-06-02 ENCOUNTER — Other Ambulatory Visit: Payer: Self-pay | Admitting: *Deleted

## 2021-06-02 MED ORDER — CLOPIDOGREL BISULFATE 75 MG PO TABS: 75.0000 mg | ORAL_TABLET | Freq: Every day | ORAL | 3 refills | Status: AC

## 2021-06-09 DIAGNOSIS — D0439 Carcinoma in situ of skin of other parts of face: Secondary | ICD-10-CM | POA: Diagnosis not present

## 2021-06-09 DIAGNOSIS — C44329 Squamous cell carcinoma of skin of other parts of face: Secondary | ICD-10-CM | POA: Diagnosis not present

## 2021-06-16 DIAGNOSIS — Z136 Encounter for screening for cardiovascular disorders: Secondary | ICD-10-CM | POA: Diagnosis not present

## 2021-06-16 DIAGNOSIS — Z13220 Encounter for screening for lipoid disorders: Secondary | ICD-10-CM | POA: Diagnosis not present

## 2021-06-16 DIAGNOSIS — R7302 Impaired glucose tolerance (oral): Secondary | ICD-10-CM | POA: Diagnosis not present

## 2021-07-21 ENCOUNTER — Ambulatory Visit: Payer: Medicare HMO | Admitting: Cardiology

## 2021-07-21 VITALS — BP 120/70 | HR 66 | Ht 61.0 in | Wt 175.0 lb

## 2021-07-21 DIAGNOSIS — I10 Essential (primary) hypertension: Secondary | ICD-10-CM | POA: Diagnosis not present

## 2021-07-21 DIAGNOSIS — I2510 Atherosclerotic heart disease of native coronary artery without angina pectoris: Secondary | ICD-10-CM | POA: Diagnosis not present

## 2021-07-21 DIAGNOSIS — E782 Mixed hyperlipidemia: Secondary | ICD-10-CM | POA: Diagnosis not present

## 2021-07-21 NOTE — Patient Instructions (Signed)
Medication Instructions:  Continue all current medications.   Labwork: none  Testing/Procedures: none  Follow-Up: 6 months   Any Other Special Instructions Will Be Listed Below (If Applicable).   If you need a refill on your cardiac medications before your next appointment, please call your pharmacy.  

## 2021-07-21 NOTE — Progress Notes (Signed)
? ? ? ?Clinical Summary ?Ms. [name] is a 86 y.o.female ? ?seen today for follow up of the following medical problems ?  ?1. CAD ?- prior NSTEMI in 2006, BMS to RCA. In stent restenosis in 2007 with another DES placed to RCA, she has been committed to life long plavix.   ?- Exercise MPI 06/2012: exercised 3 min 50 seconds, 7 METs, 89% THR, LVEF 69%, no ischemia.   ?- she completed a Lexiscan 06/20/15 that showed no ischemia ? ?  ?- no chest pains, no SOB/DOE ?- compliant with meds ?  ?2. Hyperlipidemia ?- multiple statins caused leg pains, has not wanted to restart a statin.  ?- she stopped zetia. Reports felt fatigue, low energy.  ?- Jan 2020 TC 202 TG 105 HDL 64 LDL 117 ?- hestitant for repatha.  ?  ?- 06/2021 TC 234 TG 79 HDL 79 LDL 141 ?- remains resistant for pcsk29mb ?  ?3. HTN ?- has not taken meds yet today.  ?- recent pcp visit 110/80 ?  ?  ?4. Leg edema ?-taking lasix '20mg'$  once daily, edema is controlled ? ? ?Past Medical History:  ?Diagnosis Date  ? Abnormal cardiovascular function study   ?  Cardiolite 2010 questionable lateral defect low risk ejection fraction 61%   ? Acute GI bleeding   ? No recurrence and stable  ? Cancer ([facility]   ? Skin  ? Chronic neck pain   ? Coronary artery disease   ? Status post non-ST elevation microinfarction with bare-metal stent to circumflex. 2006. Non-ST elevation myocardial infarction with in-stent restenosis 2007 requiring drug-eluting stent., Lifelong Plavix  ? Dyslipidemia   ? On statin drug therapy  ? GERD (gastroesophageal reflux disease)   ?  on H2 blocker  ? Hypertension   ? Postmenopausal bleeding   ? history  ? ? ? ?Allergies  ?Allergen Reactions  ? Penicillins Swelling and Rash  ?  REACTION: unspecified  ? ? ? ?Current Outpatient Medications  ?Medication Sig Dispense Refill  ? ALPRAZolam (XANAX) 0.5 MG tablet Take 0.5 mg by mouth as needed.     ? aspirin 81 MG tablet Take 81 mg by mouth daily.    ? clopidogrel (PLAVIX) 75 MG tablet Take 1 tablet (75 mg total) by  mouth daily. 90 tablet 3  ? furosemide (LASIX) 20 MG tablet Take one tab daily as needed 90 tablet 3  ? lisinopril-hydrochlorothiazide (PRINZIDE,ZESTORETIC) 20-25 MG per tablet Take 1 tablet by mouth daily.    ? metoprolol (LOPRESSOR) 50 MG tablet Take 1 tablet by mouth 2 (two) times daily.    ? nitroGLYCERIN (NITROSTAT) 0.4 MG SL tablet Place 1 tablet (0.4 mg total) under the tongue every 5 (five) minutes as needed. 25 tablet 3  ? ?No current facility-administered medications for this visit.  ? ? ? ?Past Surgical History:  ?Procedure Laterality Date  ? BREAST SURGERY Right 10/15/11  ? lump removed  ? CARDIAC CATHETERIZATION  10/06. 02/07  ? left heart, with bare metal stent  ? CERVICAL POLYPECTOMY  06/13/03  ? endometrial  ? CHOLECYSTECTOMY  1970  ? DILATION AND CURETTAGE OF UTERUS    ? SKIN CANCER EXCISION  2013  ? ? ? ?Allergies  ?Allergen Reactions  ? Penicillins Swelling and Rash  ?  REACTION: unspecified  ? ? ? ? ?Family History  ?Problem Relation Age of Onset  ? Cancer Mother   ?     stomach  ? Hypertension Father   ? Heart disease Father   ?  Diabetes Father   ? Hypertension Brother   ? Hypertension Son   ? Heart disease Maternal Grandmother   ? Early death Maternal Grandfather   ? ? ? ?Social History ?Ms. [name] reports that she has never smoked. She has never used smokeless tobacco. ?Ms. [name] reports no history of alcohol use. ? ? ?Review of Systems ?CONSTITUTIONAL: No weight loss, fever, chills, weakness or fatigue.  ?HEENT: Eyes: No visual loss, blurred vision, double vision or yellow sclerae.No hearing loss, sneezing, congestion, runny nose or sore throat.  ?SKIN: No rash or itching.  ?CARDIOVASCULAR: per hpi ?RESPIRATORY: No shortness of breath, cough or sputum.  ?GASTROINTESTINAL: No anorexia, nausea, vomiting or diarrhea. No abdominal pain or blood.  ?GENITOURINARY: No burning on urination, no polyuria ?NEUROLOGICAL: No headache, dizziness, syncope, paralysis, ataxia, numbness or tingling in the  extremities. No change in bowel or bladder control.  ?MUSCULOSKELETAL: No muscle, back pain, joint pain or stiffness.  ?LYMPHATICS: No enlarged nodes. No history of splenectomy.  ?PSYCHIATRIC: No history of depression or anxiety.  ?ENDOCRINOLOGIC: No reports of sweating, cold or heat intolerance. No polyuria or polydipsia.  ?. ? ? ?Physical Examination ?Today's Vitals  ? 07/21/21 0951 07/21/21 1026  ?BP: (!) 146/70 120/70  ?Pulse: 66   ?Weight: 175 lb (79.4 kg)   ?Height: '5\' 1"'$  (1.549 m)   ? ?Body mass index is 33.07 kg/m?. ? ? ? ?Gen: resting comfortably, no acute distress ?HEENT: no scleral icterus, pupils equal round and reactive, no palptable cervical adenopathy,  ?CV: RRR, no m/r/g no jvd ?Resp: Clear to auscultation bilaterally ?GI: abdomen is soft, non-tender, non-distended, normal bowel sounds, no hepatosplenomegaly ?MSK: extremities are warm, no edema.  ?Skin: warm, no rash ?Neuro:  no focal deficits ?Psych: appropriate affect ? ? ?Diagnostic Studies ?06/2012 Exercise MPI ?Exercise MPI 06/2012: exercised 3 min 50 seconds, 7 METs, 89% THR, LVEF 69%, no ischemia.   ?  ?  ?06/2015 Lexiscan MPI ?The study is normal. ?This is a low risk study. ?Nuclear stress EF: 71%. ? ? ? ?Assessment and Plan  ?1. CAD ?- no symptoms ?- EKG today shows SR, no ischemic changes ?- has been committed to indefinitie DAPT ?  ?2. HTN ?- at goal, continue current meds ?  ?3. Hyperlipidemia ?- intolerant to statins and zetia.  ?- remains resistant to pcsk34mb. Wants to work on dietary changes and recheck lipids in next few months, states she would be open to possibly starting at that time ?F/u 6 months, order lipid panel at that visit ? ? ? ? ?JArnoldo Lenis M.D. ?

## 2021-07-22 ENCOUNTER — Telehealth: Payer: Self-pay | Admitting: Cardiology

## 2021-07-22 DIAGNOSIS — E782 Mixed hyperlipidemia: Secondary | ICD-10-CM

## 2021-07-22 NOTE — Telephone Encounter (Signed)
Patient walked in stating that she was seen yesterday in [place]. Wants the medication for Cholesterol called to [facility] ?

## 2021-07-23 NOTE — Telephone Encounter (Signed)
If willing to start repathat (the injection we talked about yesterday) then please refer her to lipid clinic so they can help with the formal assessment and process ? ?Zandra Abts MD ?

## 2021-09-10 DIAGNOSIS — H35363 Drusen (degenerative) of macula, bilateral: Secondary | ICD-10-CM | POA: Diagnosis not present

## 2021-09-30 ENCOUNTER — Encounter: Payer: Self-pay | Admitting: Internal Medicine

## 2021-09-30 ENCOUNTER — Ambulatory Visit: Payer: Medicare HMO | Admitting: Internal Medicine

## 2021-09-30 VITALS — BP 124/62 | HR 63 | Ht 60.0 in | Wt 177.4 lb

## 2021-09-30 DIAGNOSIS — I10 Essential (primary) hypertension: Secondary | ICD-10-CM

## 2021-09-30 DIAGNOSIS — T466X5A Adverse effect of antihyperlipidemic and antiarteriosclerotic drugs, initial encounter: Secondary | ICD-10-CM

## 2021-09-30 DIAGNOSIS — M791 Myalgia, unspecified site: Secondary | ICD-10-CM

## 2021-09-30 DIAGNOSIS — I2510 Atherosclerotic heart disease of native coronary artery without angina pectoris: Secondary | ICD-10-CM

## 2021-09-30 DIAGNOSIS — E785 Hyperlipidemia, unspecified: Secondary | ICD-10-CM | POA: Diagnosis not present

## 2021-09-30 MED ORDER — REPATHA SURECLICK 140 MG/ML ~~LOC~~ SOAJ: 2.0000 mL | SUBCUTANEOUS | 2 refills | Status: DC

## 2021-09-30 NOTE — Progress Notes (Signed)
LIPID CLINIC CONSULT NOTE  Chief Complaint:  Manage dyslipidemia  Primary Care Physician: Leeanne Rio, MD  Primary Cardiologist:  Carlyle Dolly, MD  HPI:  [name] is a 86 y.o. female who is being seen today for the evaluation of dyslipidemia at the request of Branch, Alphonse Guild, MD. this is a pleasant 86 year old female kindly referred for evaluation management of dyslipidemia.  She has a history of coronary artery disease with prior NSTEMI in 2006 and received a bare-metal stent at that time.  She then developed in-stent restenosis in 2007 and had a drug-eluting stent at that time.  She will remain on lifelong Plavix.  Since that she is done fairly well.  Unfortunately she has had intolerance to statins, failing a number of statins which caused myalgias.  More recently she was trialed on Zetia but she reported fatigue and low energy.  Cholesterol had been elevated last in 2020 with LDL 117 and total cholesterol 202.  She recently had some weight loss, unfortunately however her cholesterol actually is higher with total cholesterol in March 2023 of 234, triglycerides 79, HDL 79 and LDL 141.  Her goal LDL is less than 70.  We discussed options today and my recommendations would be to pursue a PCSK9 inhibitor.  PMHx:  Past Medical History:  Diagnosis Date   Abnormal cardiovascular function study     Cardiolite 2010 questionable lateral defect low risk ejection fraction 61%    Acute GI bleeding    No recurrence and stable   Cancer (HCC)    Skin   Chronic neck pain    Coronary artery disease    Status post non-ST elevation microinfarction with bare-metal stent to circumflex. 2006. Non-ST elevation myocardial infarction with in-stent restenosis 2007 requiring drug-eluting stent., Lifelong Plavix   Dyslipidemia    On statin drug therapy   GERD (gastroesophageal reflux disease)     on H2 blocker   Hypertension    Postmenopausal bleeding    history    Past Surgical  History:  Procedure Laterality Date   BREAST SURGERY Right 10/15/11   lump removed   CARDIAC CATHETERIZATION  10/06. 02/07   left heart, with bare metal stent   CERVICAL POLYPECTOMY  06/13/03   endometrial   CHOLECYSTECTOMY  1970   DILATION AND CURETTAGE OF UTERUS     SKIN CANCER EXCISION  2013    FAMHx:  Family History  Problem Relation Age of Onset   Cancer Mother        stomach   Hypertension Father    Heart disease Father    Diabetes Father    Hypertension Brother    Hypertension Son    Heart disease Maternal Grandmother    Early death Maternal Grandfather     SOCHx:   reports that she has never smoked. She has never used smokeless tobacco. She reports that she does not drink alcohol and does not use drugs.  ALLERGIES:  Allergies  Allergen Reactions   Penicillins Swelling and Rash    REACTION: unspecified    ROS: Pertinent items noted in HPI and remainder of comprehensive ROS otherwise negative.  HOME MEDS: Current Outpatient Medications on File Prior to Visit  Medication Sig Dispense Refill   ALPRAZolam (XANAX) 0.5 MG tablet Take 0.5 mg by mouth as needed.      aspirin 81 MG tablet Take 81 mg by mouth daily.     clopidogrel (PLAVIX) 75 MG tablet Take 1 tablet (75 mg total) by  mouth daily. 90 tablet 3   furosemide (LASIX) 20 MG tablet Take one tab daily as needed 90 tablet 3   lisinopril-hydrochlorothiazide (PRINZIDE,ZESTORETIC) 20-25 MG per tablet Take 1 tablet by mouth daily.     metoprolol (LOPRESSOR) 50 MG tablet Take 1 tablet by mouth 2 (two) times daily.     nitroGLYCERIN (NITROSTAT) 0.4 MG SL tablet Place 1 tablet (0.4 mg total) under the tongue every 5 (five) minutes as needed. 25 tablet 3   No current facility-administered medications on file prior to visit.    LABS/IMAGING: No results found for this or any previous visit (from the past 48 hour(s)). No results found.  LIPID PANEL: No results found for: "CHOL", "TRIG", "HDL", "CHOLHDL", "VLDL",  "LDLCALC", "LDLDIRECT"  WEIGHTS: Wt Readings from Last 3 Encounters:  09/30/21 177 lb 6.4 oz (80.5 kg)  07/21/21 175 lb (79.4 kg)  01/15/21 182 lb (82.6 kg)    VITALS: BP 124/62   Pulse 63   Ht 5' (1.524 m)   Wt 177 lb 6.4 oz (80.5 kg)   SpO2 97%   BMI 34.65 kg/m   EXAM: Deferred  EKG: Deferred  ASSESSMENT: CAD status post NSTEMI with BMS in 2006 and then DES for ISR in 2007 RCA Mixed dyslipidemia, goal LDL less than 70 Statin and ezetimibe intolerant-myalgia, fatigue Hypertension  PLAN: 1.   Mrs. Gaskins has history of coronary artery disease and has had poorly controlled dyslipidemia intolerant to statins and ezetimibe.  She is a good candidate for PCSK9 inhibitor would likely reach a target LDL less than 70.  Would recommend Repatha 140 mg every 2 weeks.  We will reach out for prior authorization.  I have encouraged her to consider applying for the health well grant which may help with the cost.  In addition, we will follow-up with a lipid NMR and LP(a) in about 3 to 4 months.  Thanks again for the kind referral.  Pixie Casino, MD, FACC, [place] Director of the Advanced Lipid Disorders &  Cardiovascular Risk Reduction Clinic Diplomate of the American Board of Clinical Lipidology Attending Cardiologist  Direct Dial: [phone]  Fax: [phone]  Website:  [email or web address] 09/30/2021, 8:41 AM

## 2021-09-30 NOTE — Patient Instructions (Addendum)
Medication Instructions:  Your physician has recommended you make the following change in your medication:   Start Repatha Injections- Your insurance may want a prior authorization for this medication.   Labwork: Lipid NMR Lipoprotein(a)  Testing/Procedures: None  Follow-Up: Follow up with Dr. Debara Pickett in 3-4 months.   Any Other Special Instructions Will Be Listed Below (If Applicable).  [email or web address] - Patient assistance    If you need a refill on your cardiac medications before your next appointment, please call your pharmacy.

## 2021-10-02 ENCOUNTER — Telehealth: Payer: Self-pay

## 2021-10-02 NOTE — Telephone Encounter (Signed)
PA for Praluent 150 mg/mL, an insurance preferred drug, was approved by [facility] with PA Case ID: [id number] as of 04/11/2022.

## 2021-10-05 MED ORDER — PRALUENT 150 MG/ML ~~LOC~~ SOAJ: 1.0000 | SUBCUTANEOUS | 11 refills | Status: DC

## 2021-10-05 NOTE — Addendum Note (Signed)
Addended by: Lindell Spar on: 10/05/2021 03:25 PM   Modules accepted: Orders

## 2021-10-05 NOTE — Telephone Encounter (Signed)
Rx sent to pharmacy  Patient updated of med approval/Rx via MyChart message

## 2021-10-06 ENCOUNTER — Other Ambulatory Visit (HOSPITAL_COMMUNITY)
Admission: RE | Admit: 2021-10-06 | Discharge: 2021-10-06 | Disposition: A | Discharge status: Home / Self Care | Payer: Medicare HMO | Source: Ambulatory Visit | Attending: Internal Medicine | Admitting: Internal Medicine

## 2021-10-06 DIAGNOSIS — E785 Hyperlipidemia, unspecified: Secondary | ICD-10-CM | POA: Diagnosis not present

## 2021-10-07 LAB — NMR, LIPOPROFILE
Cholesterol, Total: 217 mg/dL — ABNORMAL HIGH (ref 100–199)
HDL Cholesterol by NMR: 76 mg/dL (ref >39)
HDL Particle Number: 40 umol/L (ref >=30.5)
LDL Particle Number: 1337 nmol/L — ABNORMAL HIGH (ref <1000)
LDL Size: 21.2 nm (ref >20.5)
LDL-C (NIH Calc): 128 mg/dL — ABNORMAL HIGH (ref 0–99)
LP-IR Score: <25 (ref <=45)
Small LDL Particle Number: 416 nmol/L (ref <=527)
Triglycerides by NMR: 74 mg/dL (ref 0–149)

## 2021-10-07 LAB — LIPOPROTEIN A (LPA): Lipoprotein (a): 174.7 nmol/L — ABNORMAL HIGH (ref <75.0)

## 2021-10-09 ENCOUNTER — Other Ambulatory Visit: Payer: Self-pay | Admitting: *Deleted

## 2021-10-09 DIAGNOSIS — E785 Hyperlipidemia, unspecified: Secondary | ICD-10-CM

## 2021-10-12 ENCOUNTER — Other Ambulatory Visit: Payer: Self-pay | Admitting: *Deleted

## 2021-10-12 DIAGNOSIS — E785 Hyperlipidemia, unspecified: Secondary | ICD-10-CM

## 2021-12-08 DIAGNOSIS — I10 Essential (primary) hypertension: Secondary | ICD-10-CM | POA: Diagnosis not present

## 2021-12-08 DIAGNOSIS — M1A0420 Idiopathic chronic gout, left hand, without tophus (tophi): Secondary | ICD-10-CM | POA: Diagnosis not present

## 2022-01-07 DIAGNOSIS — R7989 Other specified abnormal findings of blood chemistry: Secondary | ICD-10-CM | POA: Diagnosis not present

## 2022-01-07 DIAGNOSIS — E669 Obesity, unspecified: Secondary | ICD-10-CM | POA: Diagnosis not present

## 2022-01-07 DIAGNOSIS — Z6833 Body mass index (BMI) 33.0-33.9, adult: Secondary | ICD-10-CM | POA: Diagnosis not present

## 2022-01-07 DIAGNOSIS — M1991 Primary osteoarthritis, unspecified site: Secondary | ICD-10-CM | POA: Diagnosis not present

## 2022-01-07 DIAGNOSIS — M1A09X0 Idiopathic chronic gout, multiple sites, without tophus (tophi): Secondary | ICD-10-CM | POA: Diagnosis not present

## 2022-01-12 DIAGNOSIS — Z23 Encounter for immunization: Secondary | ICD-10-CM | POA: Diagnosis not present

## 2022-01-20 ENCOUNTER — Other Ambulatory Visit (HOSPITAL_COMMUNITY)
Admission: RE | Admit: 2022-01-20 | Discharge: 2022-01-20 | Disposition: A | Discharge status: Home / Self Care | Payer: Medicare HMO | Source: Ambulatory Visit | Attending: Internal Medicine | Admitting: Internal Medicine

## 2022-01-20 DIAGNOSIS — E785 Hyperlipidemia, unspecified: Secondary | ICD-10-CM | POA: Insufficient documentation

## 2022-01-21 LAB — MISC LABCORP TEST (SEND OUT): Labcorp test code: 884247

## 2022-01-21 LAB — LIPOPROTEIN A (LPA): Lipoprotein (a): 95.2 nmol/L — ABNORMAL HIGH (ref <75.0)

## 2022-01-26 ENCOUNTER — Ambulatory Visit: Payer: Medicare HMO | Attending: Cardiology | Admitting: Cardiology

## 2022-01-26 ENCOUNTER — Encounter: Payer: Self-pay | Admitting: Cardiology

## 2022-01-26 VITALS — BP 134/70 | HR 56 | Ht 61.0 in | Wt 179.4 lb

## 2022-01-26 DIAGNOSIS — I2510 Atherosclerotic heart disease of native coronary artery without angina pectoris: Secondary | ICD-10-CM | POA: Diagnosis not present

## 2022-01-26 DIAGNOSIS — E782 Mixed hyperlipidemia: Secondary | ICD-10-CM | POA: Diagnosis not present

## 2022-01-26 DIAGNOSIS — I10 Essential (primary) hypertension: Secondary | ICD-10-CM | POA: Diagnosis not present

## 2022-01-26 NOTE — Progress Notes (Signed)
Clinical Summary [name] is a 86 y.o.female seen today for follow up of the following medical problems   1. CAD - prior NSTEMI in 2006, BMS to RCA. In stent restenosis in 2007 with another DES placed to RCA, she has been committed to life long plavix.   - Exercise MPI 06/2012: exercised 3 min 50 seconds, 7 METs, 89% THR, LVEF 69%, no ischemia.   - she completed a Lexiscan 06/20/15 that showed no ischemia     -no chest pains, no SOB/DOE - compliant with meds   2. Hyperlipidemia - multiple statins caused leg pains, has not wanted to restart a statin.  - she stopped zetia. Reports felt fatigue, low energy.  - Jan 2020 TC 202 TG 105 HDL 64 LDL 117 - 06/2021 TC 234 TG 79 HDL 79 LDL 141  - had been resistant to pcsk9i but did agree to lipid clinic referal, started praluent  - LDL 45  on most recent labs.has f/u with lipid clinic later this week    3. HTN - has not taken meds yet today.  - recent pcp visit 110/80  - home bp's 160s-170s/60s, was on prednisone recently.      4. Leg edema - overall controlled on lasix '20mg'$  daily.   5. Gout - recently on prednisone    Past Medical History:  Diagnosis Date   Abnormal cardiovascular function study     Cardiolite 2010 questionable lateral defect low risk ejection fraction 61%    Acute GI bleeding    No recurrence and stable   Cancer (HCC)    Skin   Chronic neck pain    Coronary artery disease    Status post non-ST elevation microinfarction with bare-metal stent to circumflex. 2006. Non-ST elevation myocardial infarction with in-stent restenosis 2007 requiring drug-eluting stent., Lifelong Plavix   Dyslipidemia    On statin drug therapy   GERD (gastroesophageal reflux disease)     on H2 blocker   Hypertension    Postmenopausal bleeding    history     Allergies  Allergen Reactions   Penicillins Swelling and Rash    REACTION: unspecified     Current Outpatient Medications  Medication Sig Dispense Refill    Alirocumab (PRALUENT) 150 MG/ML SOAJ Inject 1 Dose into the skin every 14 (fourteen) days. 2 mL 11   allopurinol (ZYLOPRIM) 100 MG tablet Take 200 mg by mouth daily.     aspirin 81 MG tablet Take 81 mg by mouth daily.     clopidogrel (PLAVIX) 75 MG tablet Take 1 tablet (75 mg total) by mouth daily. 90 tablet 3   colchicine 0.6 MG tablet Take 0.6 mg by mouth daily.     furosemide (LASIX) 20 MG tablet Take one tab daily as needed 90 tablet 3   lisinopril (ZESTRIL) 20 MG tablet Take 20 mg by mouth daily.     metoprolol (LOPRESSOR) 50 MG tablet Take 1 tablet by mouth 2 (two) times daily.     nitroGLYCERIN (NITROSTAT) 0.4 MG SL tablet Place 1 tablet (0.4 mg total) under the tongue every 5 (five) minutes as needed. 25 tablet 3   No current facility-administered medications for this visit.     Past Surgical History:  Procedure Laterality Date   BREAST SURGERY Right 10/15/11   lump removed   CARDIAC CATHETERIZATION  10/06. 02/07   left heart, with bare metal stent   CERVICAL POLYPECTOMY  06/13/03   endometrial   CHOLECYSTECTOMY  1970  DILATION AND CURETTAGE OF UTERUS     SKIN CANCER EXCISION  2013     Allergies  Allergen Reactions   Penicillins Swelling and Rash    REACTION: unspecified      Family History  Problem Relation Age of Onset   Cancer Mother        stomach   Hypertension Father    Heart disease Father    Diabetes Father    Hypertension Brother    Hypertension Son    Heart disease Maternal Grandmother    Early death Maternal Grandfather      Social History [name] reports that she has never smoked. She has never used smokeless tobacco. [name] reports no history of alcohol use.   Review of Systems CONSTITUTIONAL: No weight loss, fever, chills, weakness or fatigue.  HEENT: Eyes: No visual loss, blurred vision, double vision or yellow sclerae.No hearing loss, sneezing, congestion, runny nose or sore throat.  SKIN: No rash or itching.  CARDIOVASCULAR:  per hpi RESPIRATORY: No shortness of breath, cough or sputum.  GASTROINTESTINAL: No anorexia, nausea, vomiting or diarrhea. No abdominal pain or blood.  GENITOURINARY: No burning on urination, no polyuria NEUROLOGICAL: No headache, dizziness, syncope, paralysis, ataxia, numbness or tingling in the extremities. No change in bowel or bladder control.  MUSCULOSKELETAL: No muscle, back pain, joint pain or stiffness.  LYMPHATICS: No enlarged nodes. No history of splenectomy.  PSYCHIATRIC: No history of depression or anxiety.  ENDOCRINOLOGIC: No reports of sweating, cold or heat intolerance. No polyuria or polydipsia.  Marland Kitchen   Physical Examination Vitals:   01/26/22 1109 01/26/22 1154  BP: (!) 160/64 134/70  Pulse: (!) 56   SpO2: 96%    Filed Weights   01/26/22 1109  Weight: 179 lb 6.4 oz (81.4 kg)    Gen: resting comfortably, no acute distress HEENT: no scleral icterus, pupils equal round and reactive, no palptable cervical adenopathy,  CV: RRR, no m/r/g, no jvd Resp: Clear to auscultation bilaterally GI: abdomen is soft, non-tender, non-distended, normal bowel sounds, no hepatosplenomegaly MSK: extremities are warm, no edema.  Skin: warm, no rash Neuro:  no focal deficits Psych: appropriate affect   Diagnostic Studies  06/2012 Exercise MPI Exercise MPI 06/2012: exercised 3 min 50 seconds, 7 METs, 89% THR, LVEF 69%, no ischemia.       06/2015 Lexiscan MPI The study is normal. This is a low risk study. Nuclear stress EF: 71%.     Assessment and Plan   1. CAD - has been committed to indefinitie DAPT - no symptoms, continue current meds   2. HTN -essentially at goal on manual recheck, some high home bp's recently in setting of gout flare and on prednisone - continue current mede   3. Hyperlipidemia - intolerant to statins and zetia.  - started on praluent with lipid clinic f/u later this week  F/u 6 months   Arnoldo Lenis, M.D.

## 2022-01-26 NOTE — Patient Instructions (Signed)

## 2022-01-29 ENCOUNTER — Encounter (HOSPITAL_BASED_OUTPATIENT_CLINIC_OR_DEPARTMENT_OTHER): Payer: Self-pay | Admitting: Internal Medicine

## 2022-01-29 ENCOUNTER — Telehealth (INDEPENDENT_AMBULATORY_CARE_PROVIDER_SITE_OTHER): Payer: Medicare HMO | Admitting: Internal Medicine

## 2022-01-29 VITALS — BP 168/67 | HR 69 | Ht 61.0 in | Wt 179.0 lb

## 2022-01-29 DIAGNOSIS — M791 Myalgia, unspecified site: Secondary | ICD-10-CM

## 2022-01-29 DIAGNOSIS — E785 Hyperlipidemia, unspecified: Secondary | ICD-10-CM | POA: Diagnosis not present

## 2022-01-29 DIAGNOSIS — T466X5D Adverse effect of antihyperlipidemic and antiarteriosclerotic drugs, subsequent encounter: Secondary | ICD-10-CM

## 2022-01-29 DIAGNOSIS — I2510 Atherosclerotic heart disease of native coronary artery without angina pectoris: Secondary | ICD-10-CM

## 2022-01-29 DIAGNOSIS — E7841 Elevated Lipoprotein(a): Secondary | ICD-10-CM

## 2022-01-29 DIAGNOSIS — T466X5A Adverse effect of antihyperlipidemic and antiarteriosclerotic drugs, initial encounter: Secondary | ICD-10-CM

## 2022-01-29 NOTE — Progress Notes (Signed)
Virtual Visit via Video Note   Because of [name]'s co-morbid illnesses, she is at least at moderate risk for complications without adequate follow up.  This format is felt to be most appropriate for this patient at this time.  All issues noted in this document were discussed and addressed.  A limited physical exam was performed with this format.  Please refer to the patient's chart for her consent to telehealth for [facility].     Date:  01/29/2022   ID:  [name], DOB 1936-01-24, MRN [id number] The patient was identified using 2 identifiers.  Evaluation Performed:  Follow-Up Visit  Patient Location:  [place] [address]  Provider location:   [address] [place], [place] [address]  PCP:  Leeanne Rio, MD  Cardiologist:  Carlyle Dolly, MD Electrophysiologist:  None   Chief Complaint:  Follow-up  History of Present Illness:    [name] is a 86 y.o. female who presents via audio/video conferencing for a telehealth visit today.  this is a pleasant 86 year old female kindly referred for evaluation management of dyslipidemia.  She has a history of coronary artery disease with prior NSTEMI in 2006 and received a bare-metal stent at that time.  She then developed in-stent restenosis in 2007 and had a drug-eluting stent at that time.  She will remain on lifelong Plavix.  Since that she is done fairly well.  Unfortunately she has had intolerance to statins, failing a number of statins which caused myalgias.  More recently she was trialed on Zetia but she reported fatigue and low energy.  Cholesterol had been elevated last in 2020 with LDL 117 and total cholesterol 202.  She recently had some weight loss, unfortunately however her cholesterol actually is higher with total cholesterol in March 2023 of 234, triglycerides 79, HDL 79 and LDL 141.  Her goal LDL is less than 70.  We discussed options today and my  recommendations would be to pursue a PCSK9 inhibitor.  01/29/2022  [name] returns today via video visit for follow-up.  She is doing very well.  She is tolerating PCSK9 inhibitor therapy and has had a marked reduction on Praluent.  Her LDL particle number is now down to 383, LDL-C of 45, HDL-C 70, triglycerides of 95 and small LDL particle number 129 nmol/L.  Moreover, her LP(a) is dropped significantly from 174.7 down to 95.2 nmol/L.  Prior CV studies:   The following studies were reviewed today:  Chart reviewed, lab work  PMHx:  Past Medical History:  Diagnosis Date   Abnormal cardiovascular function study     Cardiolite 2010 questionable lateral defect low risk ejection fraction 61%    Acute GI bleeding    No recurrence and stable   Cancer (HCC)    Skin   Chronic neck pain    Coronary artery disease    Status post non-ST elevation microinfarction with bare-metal stent to circumflex. 2006. Non-ST elevation myocardial infarction with in-stent restenosis 2007 requiring drug-eluting stent., Lifelong Plavix   Dyslipidemia    On statin drug therapy   GERD (gastroesophageal reflux disease)     on H2 blocker   Hypertension    Postmenopausal bleeding    history    Past Surgical History:  Procedure Laterality Date   BREAST SURGERY Right 10/15/11   lump removed   CARDIAC CATHETERIZATION  10/06. 02/07   left heart, with bare metal stent   CERVICAL POLYPECTOMY  06/13/03  endometrial   CHOLECYSTECTOMY  1970   DILATION AND CURETTAGE OF UTERUS     SKIN CANCER EXCISION  2013    FAMHx:  Family History  Problem Relation Age of Onset   Cancer Mother        stomach   Hypertension Father    Heart disease Father    Diabetes Father    Hypertension Brother    Hypertension Son    Heart disease Maternal Grandmother    Early death Maternal Grandfather     SOCHx:   reports that she has never smoked. She has never used smokeless tobacco. She reports that she does not drink alcohol  and does not use drugs.  ALLERGIES:  Allergies  Allergen Reactions   Penicillins Swelling and Rash    REACTION: unspecified    MEDS:  Current Meds  Medication Sig   Alirocumab (PRALUENT) 150 MG/ML SOAJ Inject 1 Dose into the skin every 14 (fourteen) days.   allopurinol (ZYLOPRIM) 100 MG tablet Take 200 mg by mouth daily.   aspirin 81 MG tablet Take 81 mg by mouth daily.   clopidogrel (PLAVIX) 75 MG tablet Take 1 tablet (75 mg total) by mouth daily.   colchicine 0.6 MG tablet Take 0.6 mg by mouth daily.   furosemide (LASIX) 20 MG tablet Take one tab daily as needed   lisinopril (ZESTRIL) 20 MG tablet Take 20 mg by mouth daily.   metoprolol (LOPRESSOR) 50 MG tablet Take 1 tablet by mouth 2 (two) times daily.   nitroGLYCERIN (NITROSTAT) 0.4 MG SL tablet Place 1 tablet (0.4 mg total) under the tongue every 5 (five) minutes as needed.     ROS: Pertinent items noted in HPI and remainder of comprehensive ROS otherwise negative.  Labs/Other Tests and Data Reviewed:    Recent Labs: No results found for requested labs within last 365 days.   Recent Lipid Panel Lab Results  Component Value Date/Time   TRIG 74 10/06/2021 09:02 AM   HDL 76 10/06/2021 09:02 AM    Wt Readings from Last 3 Encounters:  01/29/22 179 lb (81.2 kg)  01/26/22 179 lb 6.4 oz (81.4 kg)  09/30/21 177 lb 6.4 oz (80.5 kg)     Exam:    Vital Signs:  BP (!) 168/67   Pulse 69   Ht '5\' 1"'$  (1.549 m)   Wt 179 lb (81.2 kg)   BMI 33.82 kg/m    General appearance: alert and no distress Lungs: No visual respiratory difficulty Abdomen: Mildly overweight Extremities: No edema Neurologic: Grossly normal  ASSESSMENT & PLAN:    CAD status post NSTEMI with BMS in 2006 and then DES for ISR in 2007 RCA Mixed dyslipidemia, goal LDL less than 70 Statin and ezetimibe intolerant-myalgia, fatigue Hypertension Elevated LP(a) of 174.7, reduced to 95.2 nmol/L  [name] is doing well with marked reduction in her  lipids on PCSK9 inhibitor therapy.  She has had a marked reduction in her lipids and her LP(a).  She has no side effects with the medication.  Given her coronary history, I would advise continuing on this therapy indefinitely.  Time:   Today, I have spent 15 minutes with the patient with telehealth technology discussing dyslipidemia.     Medication Adjustments/Labs and Tests Ordered: Current medicines are reviewed at length with the patient today.  Concerns regarding medicines are outlined above.   Tests Ordered: No orders of the defined types were placed in this encounter.   Medication Changes: No orders of the defined  types were placed in this encounter.   Disposition:  in 1 year(s)  Pixie Casino, MD, [facility], [place] Director of the Advanced Lipid Disorders &  Cardiovascular Risk Reduction Clinic Diplomate of the American Board of Clinical Lipidology Attending Cardiologist  Direct Dial: [phone]  Fax: [phone]  Website:  [email or web address]  Pixie Casino, MD  01/29/2022 1:12 PM

## 2022-01-29 NOTE — Patient Instructions (Signed)
Medication Instructions:  NO CHANGES  *If you need a refill on your cardiac medications before your next appointment, please call your pharmacy*   Lab Work: FASTING lab work to assess cholesterol in 1 year  If you have labs (blood work) drawn today and your tests are completely normal, you will receive your results only by: [place] (if you have MyChart) OR A paper copy in the mail If you have any lab test that is abnormal or we need to change your treatment, we will call you to review the results.   Testing/Procedures: NONE   Follow-Up: At [facility], you and your health needs are our priority.  As part of our continuing mission to provide you with exceptional heart care, we have created designated Provider Care Teams.  These Care Teams include your primary Cardiologist (physician) and Advanced Practice Providers (APPs -  Physician Assistants and Nurse Practitioners) who all work together to provide you with the care you need, when you need it.  We recommend signing up for the patient portal called "MyChart".  Sign up information is provided on this After Visit Summary.  MyChart is used to connect with patients for Virtual Visits (Telemedicine).  Patients are able to view lab/test results, encounter notes, upcoming appointments, etc.  Non-urgent messages can be sent to your provider as well.   To learn more about what you can do with MyChart, go to [email or web address].    Your next appointment:    12 months with Dr. Debara Pickett -- lipid clinic ** CALL in June 2024 for an October appointment

## 2022-03-09 DIAGNOSIS — E669 Obesity, unspecified: Secondary | ICD-10-CM | POA: Diagnosis not present

## 2022-03-09 DIAGNOSIS — R7989 Other specified abnormal findings of blood chemistry: Secondary | ICD-10-CM | POA: Diagnosis not present

## 2022-03-09 DIAGNOSIS — M1991 Primary osteoarthritis, unspecified site: Secondary | ICD-10-CM | POA: Diagnosis not present

## 2022-03-09 DIAGNOSIS — M1A09X0 Idiopathic chronic gout, multiple sites, without tophus (tophi): Secondary | ICD-10-CM | POA: Diagnosis not present

## 2022-03-09 DIAGNOSIS — M25552 Pain in left hip: Secondary | ICD-10-CM | POA: Diagnosis not present

## 2022-03-09 DIAGNOSIS — Z6835 Body mass index (BMI) 35.0-35.9, adult: Secondary | ICD-10-CM | POA: Diagnosis not present

## 2022-03-22 ENCOUNTER — Telehealth: Payer: Self-pay | Admitting: Internal Medicine

## 2022-03-22 MED ORDER — REPATHA SURECLICK 140 MG/ML ~~LOC~~ SOAJ: 1.0000 mL | SUBCUTANEOUS | 6 refills | Status: DC

## 2022-03-22 NOTE — Telephone Encounter (Signed)
Completed.

## 2022-03-22 NOTE — Telephone Encounter (Signed)
Pt c/o medication issue:  1. Name of Medication:   Alirocumab (PRALUENT) 150 MG/ML SOAJ    2. How are you currently taking this medication (dosage and times per day)?   Inject 1 Dose into the skin every 14 (fourteen) days.   3. Are you having a reaction (difficulty breathing--STAT)? No  4. What is your medication issue? Pt states that insurance will not cover medication but will cover [place]. Pt would like to know if medication can be changed.

## 2022-03-25 NOTE — Telephone Encounter (Signed)
This doesn't go into affect until Jan 1. The prior authorization will be completed at that time.

## 2022-04-20 ENCOUNTER — Telehealth: Payer: Self-pay | Admitting: Internal Medicine

## 2022-04-20 MED ORDER — REPATHA SURECLICK 140 MG/ML ~~LOC~~ SOAJ: 1.0000 mL | SUBCUTANEOUS | 11 refills | Status: DC

## 2022-04-20 NOTE — Telephone Encounter (Signed)
Pt c/o medication issue:  1. Name of Medication:   Evolocumab (REPATHA SURECLICK) 665 MG/ML SOAJ    2. How are you currently taking this medication (dosage and times per day)?   3. Are you having a reaction (difficulty breathing--STAT)?   4. What is your medication issue? Per previous phone note, pt was approved to switch to Repatha, but it didn't go affect until January 1st. Pt calling for an update on refill and PA

## 2022-04-20 NOTE — Telephone Encounter (Signed)
PA approved through 04/12/23. Pt advised and rx sent to [facility] per pt request.

## 2022-06-01 DIAGNOSIS — M19071 Primary osteoarthritis, right ankle and foot: Secondary | ICD-10-CM | POA: Diagnosis not present

## 2022-06-01 DIAGNOSIS — M79671 Pain in right foot: Secondary | ICD-10-CM | POA: Diagnosis not present

## 2022-06-03 DIAGNOSIS — M19011 Primary osteoarthritis, right shoulder: Secondary | ICD-10-CM | POA: Diagnosis not present

## 2022-06-03 DIAGNOSIS — M1611 Unilateral primary osteoarthritis, right hip: Secondary | ICD-10-CM | POA: Diagnosis not present

## 2022-06-03 DIAGNOSIS — M25551 Pain in right hip: Secondary | ICD-10-CM | POA: Diagnosis not present

## 2022-06-03 DIAGNOSIS — Z9181 History of falling: Secondary | ICD-10-CM | POA: Diagnosis not present

## 2022-06-03 DIAGNOSIS — M25511 Pain in right shoulder: Secondary | ICD-10-CM | POA: Diagnosis not present

## 2022-06-09 DIAGNOSIS — R8290 Unspecified abnormal findings in urine: Secondary | ICD-10-CM | POA: Diagnosis not present

## 2022-06-09 DIAGNOSIS — R5383 Other fatigue: Secondary | ICD-10-CM | POA: Diagnosis not present

## 2022-06-09 DIAGNOSIS — M109 Gout, unspecified: Secondary | ICD-10-CM | POA: Diagnosis not present

## 2022-06-09 DIAGNOSIS — Z0000 Encounter for general adult medical examination without abnormal findings: Secondary | ICD-10-CM | POA: Diagnosis not present

## 2022-06-09 DIAGNOSIS — E7800 Pure hypercholesterolemia, unspecified: Secondary | ICD-10-CM | POA: Diagnosis not present

## 2022-06-22 DIAGNOSIS — E2839 Other primary ovarian failure: Secondary | ICD-10-CM | POA: Diagnosis not present

## 2022-07-13 ENCOUNTER — Ambulatory Visit: Payer: Medicare HMO | Attending: Cardiology | Admitting: Cardiology

## 2022-07-13 ENCOUNTER — Encounter: Payer: Self-pay | Admitting: Cardiology

## 2022-07-13 VITALS — BP 148/75 | HR 66 | Ht 61.0 in | Wt 177.4 lb

## 2022-07-13 DIAGNOSIS — I2510 Atherosclerotic heart disease of native coronary artery without angina pectoris: Secondary | ICD-10-CM | POA: Diagnosis not present

## 2022-07-13 DIAGNOSIS — I10 Essential (primary) hypertension: Secondary | ICD-10-CM | POA: Diagnosis not present

## 2022-07-13 DIAGNOSIS — Z79899 Other long term (current) drug therapy: Secondary | ICD-10-CM | POA: Diagnosis not present

## 2022-07-13 DIAGNOSIS — R600 Localized edema: Secondary | ICD-10-CM

## 2022-07-13 DIAGNOSIS — E782 Mixed hyperlipidemia: Secondary | ICD-10-CM

## 2022-07-13 MED ORDER — LISINOPRIL 40 MG PO TABS: 40.0000 mg | ORAL_TABLET | Freq: Every day | ORAL | 1 refills | Status: DC

## 2022-07-13 MED ORDER — FUROSEMIDE 20 MG PO TABS: 20.0000 mg | ORAL_TABLET | Freq: Every day | ORAL | 1 refills | Status: DC

## 2022-07-13 NOTE — Progress Notes (Signed)
Clinical Summary Ms. Lutfi is a 87 y.o.female seen today for follow up of the following medical problems   1. CAD - prior NSTEMI in 2006, BMS to RCA. In stent restenosis in 2007 with another DES placed to RCA, she has been committed to life long plavix.   - Exercise MPI 06/2012: exercised 3 min 50 seconds, 7 METs, 89% THR, LVEF 69%, no ischemia.   - she completed a Lexiscan 06/20/15 that showed no ischemia     -no chest pains, no SOB/DOE - compliant with meds   2. Hyperlipidemia - multiple statins caused leg pains, has not wanted to restart a statin.  - she stopped zetia. Reports felt fatigue, low energy.  - Jan 2020 TC 202 TG 105 HDL 64 LDL 117 - 06/2021 TC 234 TG 79 HDL 79 LDL 141 01/2022 LDL 45 HDL 70 TG 95   - had been resistant to pcsk9i but did agree to lipid clinic referal, started praluent  - LDL 45  on most recent labs.has f/u with lipid clinic later this week       3. HTN - has not taken meds yet today.  - recent pcp visit 110/80   - home bp's 160s-170s/60s, was on prednisone recently.   - home bp's 130s-140s/70s-90s     4. Leg edema - overall controlled on lasix 20mg  daily.  - taking lasix 20mg  daily.   5. Gout - recently on prednisone   Past Medical History:  Diagnosis Date   Abnormal cardiovascular function study     Cardiolite 2010 questionable lateral defect low risk ejection fraction 61%    Acute GI bleeding    No recurrence and stable   Cancer    Skin   Chronic neck pain    Coronary artery disease    Status post non-ST elevation microinfarction with bare-metal stent to circumflex. 2006. Non-ST elevation myocardial infarction with in-stent restenosis 2007 requiring drug-eluting stent., Lifelong Plavix   Dyslipidemia    On statin drug therapy   GERD (gastroesophageal reflux disease)     on H2 blocker   Hypertension    Postmenopausal bleeding    history     Allergies  Allergen Reactions   Penicillins Swelling and Rash    REACTION:  unspecified     Current Outpatient Medications  Medication Sig Dispense Refill   allopurinol (ZYLOPRIM) 100 MG tablet Take 200 mg by mouth daily.     aspirin 81 MG tablet Take 81 mg by mouth daily.     clopidogrel (PLAVIX) 75 MG tablet Take 1 tablet (75 mg total) by mouth daily. 90 tablet 3   colchicine 0.6 MG tablet Take 0.6 mg by mouth daily as needed.     Evolocumab (REPATHA SURECLICK) [id number] MG/ML SOAJ Inject 140 mg into the skin every 14 (fourteen) days. 2 mL 11   furosemide (LASIX) 20 MG tablet Take one tab daily as needed 90 tablet 3   lisinopril (ZESTRIL) 20 MG tablet Take 20 mg by mouth daily.     metoprolol (LOPRESSOR) 50 MG tablet Take 1 tablet by mouth 2 (two) times daily.     nitroGLYCERIN (NITROSTAT) 0.4 MG SL tablet Place 1 tablet (0.4 mg total) under the tongue every 5 (five) minutes as needed. 25 tablet 3   No current facility-administered medications for this visit.     Past Surgical History:  Procedure Laterality Date   BREAST SURGERY Right 10/15/11   lump removed   CARDIAC CATHETERIZATION  10/06.  02/07   left heart, with bare metal stent   CERVICAL POLYPECTOMY  06/13/03   endometrial   CHOLECYSTECTOMY  1970   DILATION AND CURETTAGE OF UTERUS     SKIN CANCER EXCISION  2013     Allergies  Allergen Reactions   Penicillins Swelling and Rash    REACTION: unspecified      Family History  Problem Relation Age of Onset   Cancer Mother        stomach   Hypertension Father    Heart disease Father    Diabetes Father    Hypertension Brother    Hypertension Son    Heart disease Maternal Grandmother    Early death Maternal Grandfather      Social History Ms. Bainum reports that she has never smoked. She has never used smokeless tobacco. Ms. Forni reports no history of alcohol use.   Review of Systems CONSTITUTIONAL: No weight loss, fever, chills, weakness or fatigue.  HEENT: Eyes: No visual loss, blurred vision, double vision or yellow sclerae.No  hearing loss, sneezing, congestion, runny nose or sore throat.  SKIN: No rash or itching.  CARDIOVASCULAR: per hpi RESPIRATORY: No shortness of breath, cough or sputum.  GASTROINTESTINAL: No anorexia, nausea, vomiting or diarrhea. No abdominal pain or blood.  GENITOURINARY: No burning on urination, no polyuria NEUROLOGICAL: No headache, dizziness, syncope, paralysis, ataxia, numbness or tingling in the extremities. No change in bowel or bladder control.  MUSCULOSKELETAL: No muscle, back pain, joint pain or stiffness.  LYMPHATICS: No enlarged nodes. No history of splenectomy.  PSYCHIATRIC: No history of depression or anxiety.  ENDOCRINOLOGIC: No reports of sweating, cold or heat intolerance. No polyuria or polydipsia.  Marland Kitchen   Physical Examination Today's Vitals   07/13/22 1330 07/13/22 1433  BP: (!) 158/60 (!) 148/75  Pulse: 66   SpO2: 98%   Weight: 177 lb 6.4 oz (80.5 kg)   Height: 5\' 1"  (1.549 m)    Body mass index is 33.52 kg/m.  Gen: resting comfortably, no acute distress HEENT: no scleral icterus, pupils equal round and reactive, no palptable cervical adenopathy,  CV: RRR, no mrg, no jvd Resp: Clear to auscultation bilaterally GI: abdomen is soft, non-tender, non-distended, normal bowel sounds, no hepatosplenomegaly MSK: extremities are warm,1+ bilateral LE edema Skin: warm, no rash Neuro:  no focal deficits Psych: appropriate affect   Diagnostic Studies  06/2012 Exercise MPI Exercise MPI 06/2012: exercised 3 min 50 seconds, 7 METs, 89% THR, LVEF 69%, no ischemia.       06/2015 Lexiscan MPI The study is normal. This is a low risk study. Nuclear stress EF: 71%   Assessment and Plan   1. CAD - has been committed to indefinitie DAPT -no recent symptoms, continue current meds   2. HTN -above goal, icnrease lisinopril to 40mg  daily. Check bmet 2 weeks   3. Hyperlipidemia - intolerant to statins and zetia.  - at goal on repatha, continue current therapy  4. LE  edema - some edema today, continue lasix 20mg  daily, may add additioanl 20mg  daily as needed for swelling.    F/u 6 months     Arnoldo Lenis, M.D

## 2022-07-13 NOTE — Patient Instructions (Addendum)
Medication Instructions:  Increase Lisinopril to 40mg  daily Change your Lasix to 20mg  daily, may take an additional 20mg  as needed  Continue all other medications.     Labwork: BMET, Mg - orders given today Please do in 2 weeks Office will contact with results via phone, letter or mychart.     Testing/Procedures: none  Follow-Up: 6 months   Any Other Special Instructions Will Be Listed Below (If Applicable).   If you need a refill on your cardiac medications before your next appointment, please call your pharmacy.

## 2022-07-14 DIAGNOSIS — R7989 Other specified abnormal findings of blood chemistry: Secondary | ICD-10-CM | POA: Diagnosis not present

## 2022-07-14 DIAGNOSIS — Z6834 Body mass index (BMI) 34.0-34.9, adult: Secondary | ICD-10-CM | POA: Diagnosis not present

## 2022-07-14 DIAGNOSIS — M1A09X0 Idiopathic chronic gout, multiple sites, without tophus (tophi): Secondary | ICD-10-CM | POA: Diagnosis not present

## 2022-07-14 DIAGNOSIS — M1991 Primary osteoarthritis, unspecified site: Secondary | ICD-10-CM | POA: Diagnosis not present

## 2022-07-14 DIAGNOSIS — E669 Obesity, unspecified: Secondary | ICD-10-CM | POA: Diagnosis not present

## 2022-07-14 DIAGNOSIS — M25552 Pain in left hip: Secondary | ICD-10-CM | POA: Diagnosis not present

## 2022-07-16 ENCOUNTER — Encounter: Payer: Self-pay | Admitting: *Deleted

## 2022-07-22 ENCOUNTER — Encounter: Payer: Self-pay | Admitting: Cardiology

## 2022-07-29 ENCOUNTER — Encounter: Payer: Self-pay | Admitting: Cardiology

## 2022-07-29 DIAGNOSIS — I10 Essential (primary) hypertension: Secondary | ICD-10-CM | POA: Diagnosis not present

## 2022-07-29 DIAGNOSIS — Z79899 Other long term (current) drug therapy: Secondary | ICD-10-CM | POA: Diagnosis not present

## 2022-07-29 DIAGNOSIS — R600 Localized edema: Secondary | ICD-10-CM | POA: Diagnosis not present

## 2022-08-02 ENCOUNTER — Telehealth: Payer: Self-pay | Admitting: *Deleted

## 2022-08-02 NOTE — Telephone Encounter (Signed)
-----   Message from Antoine Poche, MD sent at 07/31/2022  8:09 AM EDT ----- Labs look fine,no changes  Dominga Ferry MD

## 2022-08-02 NOTE — Telephone Encounter (Signed)
Lesle Chris, LPN 1/61/0960 45:40 AM EDT Back to Top    Notified, copy to pcp.

## 2022-08-12 DIAGNOSIS — Z008 Encounter for other general examination: Secondary | ICD-10-CM | POA: Diagnosis not present

## 2022-08-12 DIAGNOSIS — Z8249 Family history of ischemic heart disease and other diseases of the circulatory system: Secondary | ICD-10-CM | POA: Diagnosis not present

## 2022-08-12 DIAGNOSIS — K5900 Constipation, unspecified: Secondary | ICD-10-CM | POA: Diagnosis not present

## 2022-08-12 DIAGNOSIS — I129 Hypertensive chronic kidney disease with stage 1 through stage 4 chronic kidney disease, or unspecified chronic kidney disease: Secondary | ICD-10-CM | POA: Diagnosis not present

## 2022-08-12 DIAGNOSIS — E785 Hyperlipidemia, unspecified: Secondary | ICD-10-CM | POA: Diagnosis not present

## 2022-08-12 DIAGNOSIS — N3941 Urge incontinence: Secondary | ICD-10-CM | POA: Diagnosis not present

## 2022-08-12 DIAGNOSIS — I2510 Atherosclerotic heart disease of native coronary artery without angina pectoris: Secondary | ICD-10-CM | POA: Diagnosis not present

## 2022-08-12 DIAGNOSIS — N1831 Chronic kidney disease, stage 3a: Secondary | ICD-10-CM | POA: Diagnosis not present

## 2022-08-12 DIAGNOSIS — K219 Gastro-esophageal reflux disease without esophagitis: Secondary | ICD-10-CM | POA: Diagnosis not present

## 2022-08-12 DIAGNOSIS — G629 Polyneuropathy, unspecified: Secondary | ICD-10-CM | POA: Diagnosis not present

## 2022-08-12 DIAGNOSIS — E669 Obesity, unspecified: Secondary | ICD-10-CM | POA: Diagnosis not present

## 2022-08-12 DIAGNOSIS — M1990 Unspecified osteoarthritis, unspecified site: Secondary | ICD-10-CM | POA: Diagnosis not present

## 2022-08-12 DIAGNOSIS — Z809 Family history of malignant neoplasm, unspecified: Secondary | ICD-10-CM | POA: Diagnosis not present

## 2022-09-14 DIAGNOSIS — H35363 Drusen (degenerative) of macula, bilateral: Secondary | ICD-10-CM | POA: Diagnosis not present

## 2022-09-14 DIAGNOSIS — H524 Presbyopia: Secondary | ICD-10-CM | POA: Diagnosis not present

## 2022-11-22 ENCOUNTER — Other Ambulatory Visit: Payer: Self-pay | Admitting: Cardiology

## 2022-12-02 ENCOUNTER — Other Ambulatory Visit: Payer: Self-pay | Admitting: *Deleted

## 2022-12-02 DIAGNOSIS — E785 Hyperlipidemia, unspecified: Secondary | ICD-10-CM

## 2022-12-16 ENCOUNTER — Other Ambulatory Visit (HOSPITAL_COMMUNITY)
Admission: RE | Admit: 2022-12-16 | Discharge: 2022-12-16 | Disposition: A | Discharge status: Home / Self Care | Payer: Medicare HMO | Source: Ambulatory Visit | Attending: Internal Medicine | Admitting: Internal Medicine

## 2022-12-16 DIAGNOSIS — E785 Hyperlipidemia, unspecified: Secondary | ICD-10-CM | POA: Insufficient documentation

## 2022-12-20 LAB — MISC LABCORP TEST (SEND OUT): Labcorp test code: 884247

## 2022-12-21 ENCOUNTER — Telehealth: Payer: Self-pay | Admitting: Internal Medicine

## 2022-12-21 NOTE — Telephone Encounter (Signed)
Would like to talk with triage regarding specimen problem

## 2022-12-21 NOTE — Telephone Encounter (Signed)
Julaine Fusi, RN already made aware. Patient also already been notified.

## 2022-12-23 LAB — NMR, LIPOPROFILE
Cholesterol, Total: 146 mg/dL (ref 100–199)
HDL Particle Number: 37.9 umol/L (ref >=30.5)
HDL-C: 72 mg/dL (ref >39)
LDL Particle Number: 770 nmol/L (ref <1000)
LDL Size: 20.8 nm (ref >20.5)
LDL-C (NIH Calc): 61 mg/dL (ref 0–99)
LP-IR Score: 26 (ref <=45)
Small LDL Particle Number: 296 nmol/L (ref <=527)
Triglycerides: 62 mg/dL (ref 0–149)

## 2023-01-02 ENCOUNTER — Other Ambulatory Visit: Payer: Self-pay | Admitting: Cardiology

## 2023-01-18 DIAGNOSIS — Z23 Encounter for immunization: Secondary | ICD-10-CM | POA: Diagnosis not present

## 2023-01-24 ENCOUNTER — Telehealth: Payer: Self-pay | Admitting: Cardiology

## 2023-01-24 NOTE — Telephone Encounter (Signed)
Pt c/o swelling/edema: STAT if pt has developed SOB within 24 hours  If swelling, where is the swelling located?   Left leg and foot  How much weight have you gained and in what time span?   No  Have you gained 2 pounds in a day or 5 pounds in a week?   No  Do you have a log of your daily weights (if so, list)?   No  Are you currently taking a fluid pill?   Yes  Are you currently SOB?   No  Have you traveled recently in a car or plane for an extended period of time?   No  Patient is concerned she has been having swelling in her left leg and foot off and on for about a couple of months.  Patient stated she has been taking 2 fluid pills when she has the swelling and wants advice on next steps.

## 2023-01-25 NOTE — Telephone Encounter (Signed)
Spoke with patient who says she is not able to talk at this time and request that she is called back later on today. Will retry later.

## 2023-01-26 NOTE — Telephone Encounter (Signed)
Called patient in regards to her swelling. Left leg and foot. Stated that her weight hasn't changed any, but didn't weigh this morning. Reported that this has been going on for about 3 months now and is inquiring is there any testing that can be done to see why she swells since she has some heart issues. Reported that she is taking Furosemide 20 mg (2 tablets) only when she is swelling more. Otherwise she only takes once daily (20mg ). Stated that there is no change in diet as far as salt intake, but only swells in the afternoon and evening. Please advise

## 2023-01-27 NOTE — Telephone Encounter (Signed)
Can take the additional lasix more regularly, keep appointment next month to discuss in further detail and decide if any additional testing is indicated.  Dominga Ferry MD

## 2023-01-31 ENCOUNTER — Telehealth: Payer: Self-pay | Admitting: Cardiology

## 2023-01-31 NOTE — Telephone Encounter (Signed)
Left message for patient to call back  

## 2023-01-31 NOTE — Telephone Encounter (Signed)
See previous telephone note.

## 2023-01-31 NOTE — Telephone Encounter (Signed)
Pt returning nurse's call. Please advise

## 2023-01-31 NOTE — Telephone Encounter (Signed)
Patient informed and verbalized understanding of plan. 

## 2023-02-09 DIAGNOSIS — R7989 Other specified abnormal findings of blood chemistry: Secondary | ICD-10-CM | POA: Diagnosis not present

## 2023-02-09 DIAGNOSIS — M1991 Primary osteoarthritis, unspecified site: Secondary | ICD-10-CM | POA: Diagnosis not present

## 2023-02-09 DIAGNOSIS — Z6834 Body mass index (BMI) 34.0-34.9, adult: Secondary | ICD-10-CM | POA: Diagnosis not present

## 2023-02-09 DIAGNOSIS — E669 Obesity, unspecified: Secondary | ICD-10-CM | POA: Diagnosis not present

## 2023-02-09 DIAGNOSIS — M1A09X0 Idiopathic chronic gout, multiple sites, without tophus (tophi): Secondary | ICD-10-CM | POA: Diagnosis not present

## 2023-02-09 DIAGNOSIS — M25552 Pain in left hip: Secondary | ICD-10-CM | POA: Diagnosis not present

## 2023-03-03 DIAGNOSIS — Z1283 Encounter for screening for malignant neoplasm of skin: Secondary | ICD-10-CM | POA: Diagnosis not present

## 2023-03-03 DIAGNOSIS — L570 Actinic keratosis: Secondary | ICD-10-CM | POA: Diagnosis not present

## 2023-03-03 DIAGNOSIS — D225 Melanocytic nevi of trunk: Secondary | ICD-10-CM | POA: Diagnosis not present

## 2023-03-03 DIAGNOSIS — C44619 Basal cell carcinoma of skin of left upper limb, including shoulder: Secondary | ICD-10-CM | POA: Diagnosis not present

## 2023-03-03 DIAGNOSIS — L820 Inflamed seborrheic keratosis: Secondary | ICD-10-CM | POA: Diagnosis not present

## 2023-03-03 DIAGNOSIS — X32XXXD Exposure to sunlight, subsequent encounter: Secondary | ICD-10-CM | POA: Diagnosis not present

## 2023-03-04 ENCOUNTER — Encounter: Payer: Self-pay | Admitting: Nurse Practitioner

## 2023-03-04 ENCOUNTER — Ambulatory Visit: Payer: Medicare HMO | Attending: Nurse Practitioner | Admitting: Nurse Practitioner

## 2023-03-04 VITALS — BP 120/70 | HR 73 | Ht 60.0 in | Wt 175.6 lb

## 2023-03-04 DIAGNOSIS — E785 Hyperlipidemia, unspecified: Secondary | ICD-10-CM

## 2023-03-04 DIAGNOSIS — I2510 Atherosclerotic heart disease of native coronary artery without angina pectoris: Secondary | ICD-10-CM

## 2023-03-04 DIAGNOSIS — I10 Essential (primary) hypertension: Secondary | ICD-10-CM | POA: Diagnosis not present

## 2023-03-04 DIAGNOSIS — R600 Localized edema: Secondary | ICD-10-CM | POA: Diagnosis not present

## 2023-03-04 NOTE — Progress Notes (Signed)
Cardiology Office Note:  .   Date: 03/04/2023 ID:  [name], DOB December 30, 1935, MRN [id number] PCP: Denton Meek, FNP  [facility] Providers Cardiologist:  Dina Rich, MD    History of Present Illness: .   [name] is a 87 y.o. female with a PMH of CAD, s/p NSTEMI in 2006, hyperlipidemia, hypertension, gout, leg edema, past history of GI bleed, who presents today for 62-month follow-up appointment.  Previous cardiovascular history includes NSTEMI 2006, received a bare-metal stent to the RCA.  In 2007, she experienced in-stent restenosis and another drug-eluting stent was placed to the RCA, she has been committed to lifelong Plavix since.  Lexiscan in 2017 was negative for ischemia.  Last seen by Dr. Dina Rich on July 13, 2022.  Her BP was above goal at the time, lisinopril was increased to 40 mg daily.  Was noted that she had been intolerant to statins and Zetia and was tolerating Repatha well, lab work was at goal.  There was some edema noted on exam, was instructed to continue Lasix 20 mg daily and stated she may take additional 20 mg daily as needed for swelling.  Today she presents for 19-month follow-up appointment.  She states she self increased her Lasix to 40 mg daily. Says this has helped her swelling some. Says she is no longer taking Repatha. Quit taking this because she says she can no longer afford this medication. Denies any chest pain, shortness of breath, palpitations, syncope, presyncope, dizziness, orthopnea, PND, significant weight changes, acute bleeding, or claudication.  SH: Member of [employer], called the [employer] for the past 4 years. Walks 3 mornings a week and enjoys playing "Sorry" with friends at [employer].  ROS: Negative. See HPI.   Studies Reviewed: Marland Kitchen    Lexiscan 06/2015: The study is normal. This is a low risk study. Nuclear stress EF: 71%.  Physical Exam:   VS:  BP 120/70 (BP Location: Left Arm)   Pulse 73    Ht 5' (1.524 m)   Wt 175 lb 9.6 oz (79.7 kg)   SpO2 97%   BMI 34.29 kg/m    Wt Readings from Last 3 Encounters:  03/04/23 175 lb 9.6 oz (79.7 kg)  07/13/22 177 lb 6.4 oz (80.5 kg)  01/29/22 179 lb (81.2 kg)    GEN: Well nourished, well developed in no acute distress NECK: No JVD; No carotid bruits CARDIAC: S1/S2, RRR, no murmurs, rubs, gallops RESPIRATORY:  Clear to auscultation without rales, wheezing or rhonchi  ABDOMEN: Soft, non-tender, non-distended EXTREMITIES:  Trace, nonpitting edema to BLE with evidence of chronic venous insufficiency to both lower legs; No deformity   ASSESSMENT AND PLAN: .    CAD, s/p NSTEMI in 2006 Stable with no anginal symptoms. No indication for ischemic evaluation. Continue ASA, Plavix, Lisinopril, Lopressor, and NTG PRN. Heart healthy diet and regular cardiovascular exercise encouraged.   2. HLD Cannot tolerate statins and Zetia. No longer taking Repatha, says cannot afford this medication. Pt is requesting oral medication that is not a statin. Discussed medication options, however will consult clinical pharm D. Heart healthy diet and regular cardiovascular exercise encouraged.   3. HTN BP stable. Discussed to monitor BP at home at least 2 hours after medications and sitting for 5-10 minutes. No medication changes at this time. Heart healthy diet and regular cardiovascular exercise encouraged.    4. Leg edema This is secondary due to chronic venous insufficiency, noticed on PE. Continue Lasix  40 mg daily. Recommended compression stockings PRN and leg elevation PRN. Low salt, heart healthy diet  encouraged.   Dispo: Follow-up with Dr. Dina Rich or APP in 6 months or sooner if anything changes.   Signed, Sharlene Dory, NP

## 2023-03-04 NOTE — Patient Instructions (Signed)

## 2023-03-08 ENCOUNTER — Telehealth: Payer: Self-pay | Admitting: Pharmacy Technician

## 2023-03-08 ENCOUNTER — Other Ambulatory Visit (HOSPITAL_COMMUNITY): Payer: Self-pay

## 2023-03-08 NOTE — Telephone Encounter (Signed)
Pharmacy Patient Advocate Encounter   Received notification from  [facility] charts  that prior authorization for nexletol is required/requested.   Insurance verification completed.   The patient is insured through [employer] .   Per test claim: The current 03/08/23 day co-pay is, $123.16 [facility]).  No PA needed at this time. This test claim was processed through [facility]- copay amounts may vary at other pharmacies due to pharmacy/plan contracts, or as the patient moves through the different stages of their insurance plan.

## 2023-03-09 ENCOUNTER — Telehealth: Payer: Self-pay | Admitting: Nurse Practitioner

## 2023-03-09 NOTE — Telephone Encounter (Signed)
-----   Message from Sharlene Dory sent at 03/09/2023  8:30 AM EST ----- [name],   Please let patient know about the following. I reached out to our pharmacist about her cholesterol medication. Thayer Ohm the pharmacist says, "Nexletol is also 180 dollars per month. No assistance available. Patient is in coverage gap which will reset in January. Recommend restarting Repatha at that time." Please see if patient is okay with this.   Thanks!   Best,  Sharlene Dory, NP ----- Message ----- From: Cheree Ditto, [facility] Sent: 03/09/2023   8:27 AM EST To: Sharlene Dory, NP  Nexletol is also 180 dollars per month. No assistance available. Patient is in coverage gap which will reset in January. Recommend restarting Repatha at that time ----- Message ----- From: Art Buff, CPhT Sent: 03/08/2023   2:49 PM EST To: Cheree Ditto, [facility]  Pharmacy Patient Advocate Encounter  Received notification from [facility] charts that prior authorization for nexletol is required/requested.  Insurance verification completed.  The patient is insured through[employer] .  Per test claim: The current 03/08/23 day co-pay is,$123.16- [facility]).  No PA needed at this time. This test claim was processed through [facility]- copay amounts may vary at other pharmacies due to pharmacy/plan contracts, or as the patient moves through the different stages of their insurance plan. ----- Message ----- From: Cheree Ditto, [facility] Sent: 03/07/2023   7:31 AM EST To: Sharlene Dory, NP; Rx Prior Auth Team  Other options include Nexletol. Please complete PA for Nexletol 180mg  ----- Message ----- From: Sharlene Dory, NP Sent: 03/07/2023   5:29 AM EST To: [facility] Pharmd  Pt is requesting PO medication for her HLD. Can no longer afford Repatha. Cannot tolerate statins/Zetia. Please advise.   Thanks!   Best,  Sharlene Dory, NP

## 2023-03-09 NOTE — Telephone Encounter (Signed)
Patient informed and verbalized understanding of plan. Although patient currently still refuses to strart repatha after the first of the year patient does not want to pay the copay even after insurance coverage

## 2023-04-01 ENCOUNTER — Other Ambulatory Visit: Payer: Self-pay | Admitting: Cardiology

## 2023-05-26 DIAGNOSIS — Z08 Encounter for follow-up examination after completed treatment for malignant neoplasm: Secondary | ICD-10-CM | POA: Diagnosis not present

## 2023-05-26 DIAGNOSIS — Z85828 Personal history of other malignant neoplasm of skin: Secondary | ICD-10-CM | POA: Diagnosis not present

## 2023-06-16 DIAGNOSIS — R5383 Other fatigue: Secondary | ICD-10-CM | POA: Diagnosis not present

## 2023-06-16 DIAGNOSIS — Z0000 Encounter for general adult medical examination without abnormal findings: Secondary | ICD-10-CM | POA: Diagnosis not present

## 2023-06-16 DIAGNOSIS — Z79899 Other long term (current) drug therapy: Secondary | ICD-10-CM | POA: Diagnosis not present

## 2023-06-16 DIAGNOSIS — E7800 Pure hypercholesterolemia, unspecified: Secondary | ICD-10-CM | POA: Diagnosis not present

## 2023-07-28 ENCOUNTER — Other Ambulatory Visit: Payer: Self-pay

## 2023-07-28 MED ORDER — FUROSEMIDE 20 MG PO TABS: ORAL_TABLET | ORAL | 2 refills | Status: AC

## 2023-08-08 DIAGNOSIS — B079 Viral wart, unspecified: Secondary | ICD-10-CM | POA: Diagnosis not present

## 2023-08-10 DIAGNOSIS — E669 Obesity, unspecified: Secondary | ICD-10-CM | POA: Diagnosis not present

## 2023-08-10 DIAGNOSIS — R7989 Other specified abnormal findings of blood chemistry: Secondary | ICD-10-CM | POA: Diagnosis not present

## 2023-08-10 DIAGNOSIS — M25552 Pain in left hip: Secondary | ICD-10-CM | POA: Diagnosis not present

## 2023-08-10 DIAGNOSIS — M1991 Primary osteoarthritis, unspecified site: Secondary | ICD-10-CM | POA: Diagnosis not present

## 2023-08-10 DIAGNOSIS — M1A09X0 Idiopathic chronic gout, multiple sites, without tophus (tophi): Secondary | ICD-10-CM | POA: Diagnosis not present

## 2023-08-10 DIAGNOSIS — Z6834 Body mass index (BMI) 34.0-34.9, adult: Secondary | ICD-10-CM | POA: Diagnosis not present

## 2023-09-01 ENCOUNTER — Encounter: Payer: Self-pay | Admitting: Nurse Practitioner

## 2023-09-01 ENCOUNTER — Ambulatory Visit: Payer: Medicare HMO | Attending: Nurse Practitioner | Admitting: Nurse Practitioner

## 2023-09-01 VITALS — BP 122/74 | HR 62 | Ht 60.0 in | Wt 179.0 lb

## 2023-09-01 DIAGNOSIS — I2510 Atherosclerotic heart disease of native coronary artery without angina pectoris: Secondary | ICD-10-CM

## 2023-09-01 DIAGNOSIS — I10 Essential (primary) hypertension: Secondary | ICD-10-CM | POA: Diagnosis not present

## 2023-09-01 DIAGNOSIS — E785 Hyperlipidemia, unspecified: Secondary | ICD-10-CM | POA: Diagnosis not present

## 2023-09-01 NOTE — Progress Notes (Signed)
 Cardiology Office Note:  .   Date: 09/01/2023 ID:  [name], DOB [birth year], MRN [id number] PCP: Christina Flank, FNP  [facility] Providers Cardiologist:  Christina Lander, MD    History of Present Illness: .   [name] is a 88 y.o. female with a PMH of CAD, s/p NSTEMI in 2006, hyperlipidemia, hypertension, gout, leg edema, past history of GI bleed, who presents today for 52-month follow-up appointment.  Previous cardiovascular history includes NSTEMI 2006, received a bare-metal stent to the RCA.  In 2007, she experienced in-stent restenosis and another drug-eluting stent was placed to the RCA, she has been committed to lifelong Plavix  since.  Lexiscan in 2017 was negative for ischemia.  Last seen by Dr. Armida White on July 13, 2022.  Her BP was above goal at the time, lisinopril  was increased to 40 mg daily.  Was noted that she had been intolerant to statins and Zetia  and was tolerating Repatha  well, lab work was at goal.  There was some edema noted on exam, was instructed to continue Lasix  20 mg daily and stated she may take additional 20 mg daily as needed for swelling.  03/04/2023 - Today she presents for 68-month follow-up appointment.  She states she self increased her Lasix  to 40 mg daily. Says this has helped her swelling some. Says she is no longer taking Repatha . Quit taking this because she says she can no longer afford this medication. Denies any chest pain, shortness of breath, palpitations, syncope, presyncope, dizziness, orthopnea, PND, significant weight changes, acute bleeding, or claudication.  09/01/2023 -doing well and denies any acute cardiac complaints or issues.  Tells me she is no longer on Repatha  as she cannot afford this medication anymore.  Says she was not able to take other cholesterol medication that she believes was started by her PCP earlier.  Says she would like to hold off before pursuing other medications or even referral to lipid clinic  as she wants to see what her next lab work looks like with her PCP. Denies any chest pain, shortness of breath, palpitations, syncope, presyncope, dizziness, orthopnea, PND, swelling or significant weight changes, acute bleeding, or claudication.  SH: Member of [employer], called the [employer], for the past 4 years. Walks 3 mornings a week and enjoys playing "Sorry" with friends at [employer].  She just won a [employer] at the senior center and is working her way to a [employer]. ROS: Negative. See HPI.   Studies Reviewed: Aaron Aas    EKG:  EKG Interpretation Date/Time:  Thursday Sep 01 2023 14:05:55 EDT Ventricular Rate:  69 PR Interval:  170 QRS Duration:  76 QT Interval:  368 QTC Calculation: 394 R Axis:   52  Text Interpretation: Normal sinus rhythm Normal ECG When compared with ECG of 11-Jun-2005 17:11, Premature ventricular and fusion complexes are no longer Present T wave inversion now evident in Inferior leads T wave inversion no longer evident in Lateral leads Confirmed by [name] [phone]) on 09/01/2023 2:08:51 PM   Lexiscan 06/2015: The study is normal. This is a low risk study. Nuclear stress EF: 71%.  Physical Exam:   VS:  BP 122/74   Pulse 62   Ht 5' (1.524 m)   Wt 179 lb (81.2 kg)   SpO2 98%   BMI 34.96 kg/m    Wt Readings from Last 3 Encounters:  09/01/23 179 lb (81.2 kg)  03/04/23 175 lb 9.6 oz (79.7 kg)  07/13/22  177 lb 6.4 oz (80.5 kg)    GEN: Well nourished, well developed in no acute distress NECK: No JVD; No carotid bruits CARDIAC: S1/S2, RRR, no murmurs, rubs, gallops RESPIRATORY:  Clear to auscultation without rales, wheezing or rhonchi  ABDOMEN: Soft, non-tender, non-distended EXTREMITIES: No edema to BLE with evidence of chronic venous insufficiency to both lower legs; No deformity   ASSESSMENT AND PLAN: .    CAD, s/p NSTEMI in 2006 Stable with no anginal symptoms. No indication for ischemic evaluation. Continue current  medication regimen. Heart healthy diet and regular cardiovascular exercise encouraged.  No longer on aspirin as she is currently taking Plavix .  Will consult her attending cardiologist regarding this.  2. HLD Cannot tolerate statins and Zetia . No longer taking Repatha , says cannot afford this medication.  Could not take another p.o. medication that was alternative to statin.  I did offer/recommend lipid clinic referral for further evaluation, patient declines.  Patient declines any medication adjustment until she gets further evaluated by her PCP.  She defers this to her PCP.  PCP to manage.  Heart healthy diet and regular cardiovascular exercise encouraged.   3. HTN BP stable. Discussed to monitor BP at home at least 2 hours after medications and sitting for 5-10 minutes. No medication changes at this time. Heart healthy diet and regular cardiovascular exercise encouraged.   Dispo: Follow-up with Dr. Armida White or APP in 6 months or sooner if anything changes.   I spent a total duration of  minutes reviewing prior notes, reviewing outside records including  labs, EKG today, face-to-face counseling of  medical condition, pathophysiology, evaluation, management, and documenting the findings in the note.   Signed, Christina Pointer, NP

## 2023-09-01 NOTE — Patient Instructions (Addendum)

## 2023-09-07 DIAGNOSIS — D485 Neoplasm of uncertain behavior of skin: Secondary | ICD-10-CM | POA: Diagnosis not present

## 2023-09-12 NOTE — Progress Notes (Signed)
 Her cardiologist prior to me had her on long term DAPT due to prior extensive stenting. I think plavix  monotherapy is reasonable, would not use ASA alone for her. Does not have to be on DAPT, just plavix  alone is fine  Letta Raw MD

## 2023-09-16 DIAGNOSIS — H524 Presbyopia: Secondary | ICD-10-CM | POA: Diagnosis not present

## 2023-09-16 DIAGNOSIS — H353112 Nonexudative age-related macular degeneration, right eye, intermediate dry stage: Secondary | ICD-10-CM | POA: Diagnosis not present

## 2023-09-19 DIAGNOSIS — C4442 Squamous cell carcinoma of skin of scalp and neck: Secondary | ICD-10-CM | POA: Diagnosis not present

## 2023-10-13 DIAGNOSIS — C44329 Squamous cell carcinoma of skin of other parts of face: Secondary | ICD-10-CM | POA: Diagnosis not present

## 2023-10-13 DIAGNOSIS — L570 Actinic keratosis: Secondary | ICD-10-CM | POA: Diagnosis not present

## 2023-12-02 DIAGNOSIS — N183 Chronic kidney disease, stage 3 unspecified: Secondary | ICD-10-CM | POA: Diagnosis not present

## 2023-12-02 DIAGNOSIS — E7800 Pure hypercholesterolemia, unspecified: Secondary | ICD-10-CM | POA: Diagnosis not present

## 2024-01-17 DIAGNOSIS — Z23 Encounter for immunization: Secondary | ICD-10-CM | POA: Diagnosis not present

## 2024-03-01 ENCOUNTER — Encounter: Payer: Self-pay | Admitting: Nurse Practitioner

## 2024-03-01 ENCOUNTER — Ambulatory Visit: Attending: Nurse Practitioner | Admitting: Nurse Practitioner

## 2024-03-01 VITALS — BP 136/64 | HR 82 | Ht 60.0 in | Wt 175.8 lb

## 2024-03-01 DIAGNOSIS — E785 Hyperlipidemia, unspecified: Secondary | ICD-10-CM

## 2024-03-01 DIAGNOSIS — I10 Essential (primary) hypertension: Secondary | ICD-10-CM

## 2024-03-01 DIAGNOSIS — I2510 Atherosclerotic heart disease of native coronary artery without angina pectoris: Secondary | ICD-10-CM | POA: Diagnosis not present

## 2024-03-01 DIAGNOSIS — R079 Chest pain, unspecified: Secondary | ICD-10-CM

## 2024-03-01 MED ORDER — NITROGLYCERIN 0.4 MG SL SUBL: 0.4000 mg | SUBLINGUAL_TABLET | SUBLINGUAL | 3 refills | Status: AC | PRN

## 2024-03-01 NOTE — Patient Instructions (Addendum)
 Medication Instructions:   Nitroglycerin  refilled today  Continue all other medications.     Labwork:  none  Testing/Procedures:  none  Follow-Up:  Your physician wants you to follow up in:  1 year.  You should receive a recall letter in the mail about 2 months prior to the time you are due.  If you don't receive this, please call our office to schedule your follow up appointment.    Year - peck   Any Other Special Instructions Will Be Listed Below (If Applicable).   If you need a refill on your cardiac medications before your next appointment, please call your pharmacy.

## 2024-03-01 NOTE — Progress Notes (Signed)
 Cardiology Office Note:  .   Date: 03/01/2024 ID:  [name], DOB 08-Apr-1936, MRN [id number] PCP: Rosan Jacquline NOVAK, NP  [facility] Providers Cardiologist:  Alvan Carrier, MD    History of Present Illness: .   [name] is a 88 y.o. female with a PMH of CAD, s/p NSTEMI in 2006, hyperlipidemia, hypertension, gout, leg edema, past history of GI bleed, who presents today for 84-month follow-up appointment.  Previous cardiovascular history includes NSTEMI 2006, received a bare-metal stent to the RCA.  In 2007, she experienced in-stent restenosis and another drug-eluting stent was placed to the RCA, she has been committed to lifelong Plavix  since.  Lexiscan in 2017 was negative for ischemia.  Last seen by Dr. Carrier Alvan on July 13, 2022.  Her BP was above goal at the time, lisinopril  was increased to 40 mg daily.  Was noted that she had been intolerant to statins and Zetia  and was tolerating Repatha  well, lab work was at goal.  There was some edema noted on exam, was instructed to continue Lasix  20 mg daily and stated she may take additional 20 mg daily as needed for swelling.  03/04/2023 - Today she presents for 79-month follow-up appointment.  She states she self increased her Lasix  to 40 mg daily. Says this has helped her swelling some. Says she is no longer taking Repatha . Quit taking this because she says she can no longer afford this medication. Denies any chest pain, shortness of breath, palpitations, syncope, presyncope, dizziness, orthopnea, PND, significant weight changes, acute bleeding, or claudication.  09/01/2023 -doing well and denies any acute cardiac complaints or issues.  Tells me she is no longer on Repatha  as she cannot afford this medication anymore.  Says she was not able to take other cholesterol medication that she believes was started by her PCP earlier.  Says she would like to hold off before pursuing other medications or even referral to lipid  clinic as she wants to see what her next lab work looks like with her PCP. Denies any chest pain, shortness of breath, palpitations, syncope, presyncope, dizziness, orthopnea, PND, swelling or significant weight changes, acute bleeding, or claudication.  03/01/2024 - Here for follow-up. Doing well. Denies any chest pain, shortness of breath, palpitations, syncope, presyncope, dizziness, orthopnea, PND, swelling or significant weight changes, acute bleeding, or claudication.  SH: Member of [employer], called the [employer], for the past 4 years. Walks 3 mornings a week and enjoys playing Sorry with friends at [employer].  She just won a [employer] at the senior center and is working her way to a [employer]. ROS: Negative. See HPI.   Studies Reviewed: SABRA    EKG: EKG is not ordered today.      Lexiscan 06/2015: The study is normal. This is a low risk study. Nuclear stress EF: 71%.  Physical Exam:   VS:  BP 136/64   Pulse 82   Ht 5' (1.524 m)   Wt 175 lb 12.8 oz (79.7 kg)   BMI 34.33 kg/m    Wt Readings from Last 3 Encounters:  03/01/24 175 lb 12.8 oz (79.7 kg)  09/01/23 179 lb (81.2 kg)  03/04/23 175 lb 9.6 oz (79.7 kg)    GEN: Well nourished, well developed in no acute distress NECK: No JVD; No carotid bruits CARDIAC: S1/S2, RRR, no murmurs, rubs, gallops RESPIRATORY:  Clear to auscultation without rales, wheezing or rhonchi  ABDOMEN: Soft, non-tender, non-distended EXTREMITIES: No edema  to BLE with evidence of chronic venous insufficiency to both lower legs; No deformity   ASSESSMENT AND PLAN: .    CAD, s/p NSTEMI in 2006 Stable with no anginal symptoms. No indication for ischemic evaluation. Continue current medication regimen. Heart healthy diet and regular cardiovascular exercise encouraged.  Will Refill NTG.   2. HLD Cannot tolerate statins and Zetia . No longer taking Repatha , says cannot afford this medication.  Could not take another p.o.  medication that was alternative to statin.  I did offer/recommend lipid clinic referral at this visit for further evaluation, patient declines. She defers this to her PCP.  PCP to manage.  Heart healthy diet and regular cardiovascular exercise encouraged.   3. HTN BP stable. Discussed to monitor BP at home at least 2 hours after medications and sitting for 5-10 minutes. No medication changes at this time. Heart healthy diet and regular cardiovascular exercise encouraged.   I spent a total duration of 20 minutes reviewing prior notes, reviewing outside records including  labs, face-to-face counseling of medical condition, pathophysiology, evaluation, management, and documenting the findings in the note.   Dispo: Follow-up with Dr. Dorn Ross or APP in 1 year or sooner if anything changes.    Signed, Almarie Crate, NP
